# Patient Record
Sex: Female | Born: 1970 | Race: White | Hispanic: No | Marital: Married | Smoking: Former smoker
Health system: Southern US, Community
[De-identification: ages and names within clinical notes are randomized; demographics above are authoritative.]

## PROBLEM LIST (undated history)

## (undated) DIAGNOSIS — F329 Major depressive disorder, single episode, unspecified: Secondary | ICD-10-CM

## (undated) DIAGNOSIS — E669 Obesity, unspecified: Secondary | ICD-10-CM

## (undated) DIAGNOSIS — R87610 Atypical squamous cells of undetermined significance on cytologic smear of cervix (ASC-US): Secondary | ICD-10-CM

## (undated) DIAGNOSIS — J42 Unspecified chronic bronchitis: Secondary | ICD-10-CM

## (undated) DIAGNOSIS — N800 Endometriosis of uterus: Secondary | ICD-10-CM

## (undated) DIAGNOSIS — I10 Essential (primary) hypertension: Secondary | ICD-10-CM

## (undated) DIAGNOSIS — K219 Gastro-esophageal reflux disease without esophagitis: Secondary | ICD-10-CM

## (undated) DIAGNOSIS — J189 Pneumonia, unspecified organism: Secondary | ICD-10-CM

## (undated) DIAGNOSIS — F32A Depression, unspecified: Secondary | ICD-10-CM

## (undated) DIAGNOSIS — N8003 Adenomyosis of the uterus: Secondary | ICD-10-CM

## (undated) DIAGNOSIS — N920 Excessive and frequent menstruation with regular cycle: Secondary | ICD-10-CM

## (undated) DIAGNOSIS — N809 Endometriosis, unspecified: Secondary | ICD-10-CM

## (undated) HISTORY — DX: Adenomyosis of the uterus: N80.03

## (undated) HISTORY — DX: Pneumonia, unspecified organism: J18.9

## (undated) HISTORY — DX: Major depressive disorder, single episode, unspecified: F32.9

## (undated) HISTORY — DX: Excessive and frequent menstruation with regular cycle: N92.0

## (undated) HISTORY — DX: Atypical squamous cells of undetermined significance on cytologic smear of cervix (ASC-US): R87.610

## (undated) HISTORY — DX: Gastro-esophageal reflux disease without esophagitis: K21.9

## (undated) HISTORY — DX: Essential (primary) hypertension: I10

## (undated) HISTORY — DX: Endometriosis of uterus: N80.0

## (undated) HISTORY — DX: Unspecified chronic bronchitis: J42

## (undated) HISTORY — DX: Obesity, unspecified: E66.9

## (undated) HISTORY — DX: Depression, unspecified: F32.A

## (undated) HISTORY — DX: Endometriosis, unspecified: N80.9

---

## 1993-05-18 HISTORY — PX: TUBAL LIGATION: SHX77

## 2003-05-01 ENCOUNTER — Other Ambulatory Visit: Admission: RE | Admit: 2003-05-01 | Discharge: 2003-05-01 | Payer: Self-pay | Admitting: Family Medicine

## 2003-05-01 ENCOUNTER — Other Ambulatory Visit: Admission: RE | Admit: 2003-05-01 | Discharge: 2003-05-01 | Payer: Self-pay | Admitting: *Deleted

## 2003-05-08 ENCOUNTER — Encounter: Admission: RE | Admit: 2003-05-08 | Discharge: 2003-06-01 | Payer: Self-pay | Admitting: Family Medicine

## 2006-02-12 ENCOUNTER — Other Ambulatory Visit: Admission: RE | Admit: 2006-02-12 | Discharge: 2006-02-12 | Payer: Self-pay | Admitting: Family Medicine

## 2007-04-11 ENCOUNTER — Emergency Department (HOSPITAL_COMMUNITY): Admission: EM | Admit: 2007-04-11 | Discharge: 2007-04-11 | Payer: Self-pay | Admitting: Emergency Medicine

## 2009-12-12 ENCOUNTER — Encounter (INDEPENDENT_AMBULATORY_CARE_PROVIDER_SITE_OTHER): Payer: Self-pay | Admitting: *Deleted

## 2010-06-17 NOTE — Letter (Signed)
Summary: Unable to Reach, Consult Scheduled  [facility]  [address]   [place], [place] [address]   Phone: [phone]  Fax: [phone]    12/12/2009  Bethany Richardson [address] [place], [place]  [address] 08-29-1970   Dear Ms. Bethany Richardson,   We have been unable to reach you by phone.  Please contact our office with an updated phone number.  At the recommendation of DR Dimas Aguas  we have been asked to schedule you a consult with DR FIELDS for ABDOMINAL PAIN,RECTAL BLEEDING,DIARRHEA AND IBS SYMTOMS.   Please call our office at [phone].     Thank you,    Diana Eves  [facility] R. Roetta Sessions, M.D.    Jonette Eva, M.D. Lorenza Burton, FNP-BC    Tana Coast, PA-C Phone: [phone]    Fax: [phone]

## 2011-05-19 DIAGNOSIS — J189 Pneumonia, unspecified organism: Secondary | ICD-10-CM

## 2011-05-19 HISTORY — DX: Pneumonia, unspecified organism: J18.9

## 2012-08-25 DIAGNOSIS — R079 Chest pain, unspecified: Secondary | ICD-10-CM

## 2012-12-19 ENCOUNTER — Ambulatory Visit (INDEPENDENT_AMBULATORY_CARE_PROVIDER_SITE_OTHER): Payer: BC Managed Care – PPO

## 2012-12-19 ENCOUNTER — Other Ambulatory Visit: Payer: Self-pay | Admitting: Family Medicine

## 2012-12-19 ENCOUNTER — Telehealth: Payer: Self-pay | Admitting: Nurse Practitioner

## 2012-12-19 ENCOUNTER — Encounter: Payer: Self-pay | Admitting: Family Medicine

## 2012-12-19 ENCOUNTER — Ambulatory Visit (INDEPENDENT_AMBULATORY_CARE_PROVIDER_SITE_OTHER): Payer: BC Managed Care – PPO | Admitting: Family Medicine

## 2012-12-19 VITALS — BP 126/82 | HR 88 | Temp 97.7°F | Wt 234.6 lb

## 2012-12-19 DIAGNOSIS — R1032 Left lower quadrant pain: Secondary | ICD-10-CM

## 2012-12-19 LAB — POCT CBC
Granulocyte percent: 73.6 %G (ref 37–80)
HCT, POC: 40.1 % (ref 37.7–47.9)
Hemoglobin: 12.9 g/dL (ref 12.2–16.2)
Lymph, poc: 2.1 (ref 0.6–3.4)
MCH, POC: 22.1 pg — AB (ref 27–31.2)
MCHC: 32.1 g/dL (ref 31.8–35.4)
MCV: 69 fL — AB (ref 80–97)
MPV: 9.4 fL (ref 0–99.8)
POC Granulocyte: 6.9 (ref 2–6.9)
POC LYMPH PERCENT: 22.8 %L (ref 10–50)
Platelet Count, POC: 287 10*3/uL (ref 142–424)
RBC: 5.8 M/uL — AB (ref 4.04–5.48)
RDW, POC: 15.6 %
WBC: 9.4 10*3/uL (ref 4.6–10.2)

## 2012-12-19 LAB — POCT UA - MICROSCOPIC ONLY
Bacteria, U Microscopic: NEGATIVE
Casts, Ur, LPF, POC: NEGATIVE
Crystals, Ur, HPF, POC: NEGATIVE
RBC, urine, microscopic: NEGATIVE
Yeast, UA: NEGATIVE

## 2012-12-19 LAB — POCT URINALYSIS DIPSTICK
Bilirubin, UA: NEGATIVE
Blood, UA: NEGATIVE
Glucose, UA: NEGATIVE
Ketones, UA: NEGATIVE
Nitrite, UA: NEGATIVE
Spec Grav, UA: 1.02
Urobilinogen, UA: NEGATIVE
pH, UA: 6

## 2012-12-19 MED ORDER — NAPROXEN 500 MG PO TABS: 500.0000 mg | ORAL_TABLET | Freq: Two times a day (BID) | ORAL | Status: DC

## 2012-12-19 NOTE — Progress Notes (Signed)
Patient ID: Bethany Richardson, female   DOB: May 23, 1970, 42 y.o.   MRN: [id number] SUBJECTIVE: CC: Chief Complaint  Patient presents with  . Urinary Tract Infection    HPI: [facility] yeast infection and UTI. Treated  With diflucan and  Septra. This am going to work , pain in the left side and burns to urinate. Never had this before. No h/o STD, no vaginal d/c. No fever. Having cold chills, and hot flashes. Periods irregular last 3 months.  No past medical history on file. No past surgical history on file. History   Social History  . Marital Status: Married    Spouse Name: N/A    Number of Children: N/A  . Years of Education: N/A   Occupational History  . Not on file.   Social History Main Topics  . Smoking status: Not on file  . Smokeless tobacco: Not on file  . Alcohol Use: Not on file  . Drug Use: Not on file  . Sexually Active: Not on file   Other Topics Concern  . Not on file   Social History Narrative  . No narrative on file   No family history on file. No current outpatient prescriptions on file prior to visit.   No current facility-administered medications on file prior to visit.   No Known Allergies  There is no immunization history on file for this patient. Prior to Admission medications   Medication Sig Start Date End Date Taking? Authorizing Provider  hydrochlorothiazide (HYDRODIURIL) 25 MG tablet Take 25 mg by mouth daily.   Yes Historical Provider, MD  sulfamethoxazole-trimethoprim (BACTRIM DS,SEPTRA DS) 800-160 MG per tablet Take 1 tablet by mouth 2 (two) times daily.   Yes Historical Provider, MD     ROS: As above in the HPI. All other systems are stable or negative.  OBJECTIVE: APPEARANCE:  Patient in no acute distress.The patient appeared well nourished and normally developed. Acyanotic. Waist: VITAL SIGNS:BP 126/82  Pulse 88  Temp(Src) 97.7 F (36.5 C) (Oral)  Wt 234 lb 9.6 oz (106.414 kg) WF  SKIN:  warm and  Dry without overt rashes, tattoos and scars  HEAD and Neck: without JVD, Head and scalp: normal Eyes:No scleral icterus. Fundi normal, eye movements normal. Ears: Auricle normal, canal normal, Tympanic membranes normal, insufflation normal. Nose: normal Throat: normal Neck & thyroid: normal  CHEST & LUNGS: Chest wall: normal Lungs: Clear  CVS: Reveals the PMI to be normally located. Regular rhythm, First and Second Heart sounds are normal,  absence of murmurs, rubs or gallops. Peripheral vasculature: Radial pulses: normal Dorsal pedis pulses: normal Posterior pulses: normal  ABDOMEN:  Appearance: tender LLQ and left flank to palpation. Benign, no organomegaly, no masses, no Abdominal Aortic enlargement. No Guarding , no rebound. No Bruits. Bowel sounds: normal  RECTAL: N/A GU: N/A  EXTREMETIES: nonedematous. Both Femoral and Pedal pulses are normal.  MUSCULOSKELETAL:  Spine: normal Joints: intact  NEUROLOGIC: oriented to time,place and person; nonfocal. Strength is normal Sensory is normal Reflexes are normal Cranial Nerves are normal.  ASSESSMENT: Abdominal pain, left lower quadrant - Plan: POCT urinalysis dipstick, POCT UA - Microscopic Only, Urine culture, DG Abd 1 View, POCT CBC, CMP14+EGFR, Pregnancy, urine, US Pelvis Complete, CANCELED: POCT urine pregnancy  PLAN: Orders Placed This Encounter  Procedures  . Urine culture  . DG Abd 1 View    Standing Status: Future     Number of Occurrences: 1  Standing Expiration Date: 02/18/2014    Order Specific Question:  Reason for Exam (SYMPTOM  OR DIAGNOSIS REQUIRED)    Answer:  dysuria, left flank pain.    Order Specific Question:  Is the patient pregnant?    Answer:  No     Comments:  BTL    Order Specific Question:  Preferred imaging location?    Answer:  Internal  . US Pelvis Complete    Standing Status: Future     Number of Occurrences:      Standing Expiration Date: 02/18/2014     Scheduling Instructions:     Oligomenorrhea, pelvic pain and left flank pain, and dyspareunia.     S/P BTL     Patient is 41 and amenorrheic for 2 months.    Order Specific Question:  Reason for Exam (SYMPTOM  OR DIAGNOSIS REQUIRED)    Answer:  oligomenorrhea with left pelvic pain and dyspareunia.    Order Specific Question:  Preferred imaging location?    Answer:  [facility]  . CMP14+EGFR  . Pregnancy, urine  . POCT urinalysis dipstick  . POCT UA - Microscopic Only  . POCT CBC    [facility] reading (PRIMARY) by  Dr. Modesto Charon: no nephrolithiasis obvious nor cause for pain obvious.  Meds ordered this encounter  Medications  . sulfamethoxazole-trimethoprim (BACTRIM DS,SEPTRA DS) 800-160 MG per tablet    Sig: Take 1 tablet by mouth 2 (two) times daily.  . hydrochlorothiazide (HYDRODIURIL) 25 MG tablet    Sig: Take 25 mg by mouth daily.  . naproxen (NAPROSYN) 500 MG tablet    Sig: Take 1 tablet (500 mg total) by mouth 2 (two) times daily with a meal.    Dispense:  20 tablet    Refill:  0   Results for orders placed in visit on 12/19/12  POCT URINALYSIS DIPSTICK      Result Value Range   Color, UA yellow     Clarity, UA cloudy     Glucose, UA neg     Bilirubin, UA neg     Ketones, UA neg     Spec Grav, UA 1.020     Blood, UA neg     pH, UA 6.0     Protein, UA trace     Urobilinogen, UA negative     Nitrite, UA neg     Leukocytes, UA large (3+)    POCT CBC      Result Value Range   WBC 9.4  4.6 - 10.2 K/uL   Lymph, poc 2.1  0.6 - 3.4   POC LYMPH PERCENT 22.8  10 - 50 %L   POC Granulocyte 6.9  2 - 6.9   Granulocyte percent 73.6  37 - 80 %G   RBC 5.8 (*) 4.04 - 5.48 M/uL   Hemoglobin 12.9  12.2 - 16.2 g/dL   HCT, POC 16.1  09.6 - 47.9 %   MCV 69.0 (*) 80 - 97 fL   MCH, POC 22.1 (*) 27 - 31.2 pg   MCHC 32.1  31.8 - 35.4 g/dL   RDW, POC 04.5     Platelet Count, POC 287.0  142 - 424 K/uL   MPV 9.4  0 - 99.8 fL   Return in about 3 days (around 12/22/2012) for Recheck  medical problems.  Darrelle Wiberg P. Modesto Charon, M.D.

## 2012-12-19 NOTE — Telephone Encounter (Signed)
appt made

## 2012-12-20 ENCOUNTER — Ambulatory Visit (HOSPITAL_COMMUNITY)
Admission: RE | Admit: 2012-12-20 | Discharge: 2012-12-20 | Disposition: A | Discharge status: Home / Self Care | Payer: BC Managed Care – PPO | Source: Ambulatory Visit | Attending: Family Medicine | Admitting: Family Medicine

## 2012-12-20 ENCOUNTER — Telehealth: Payer: Self-pay | Admitting: Family Medicine

## 2012-12-20 ENCOUNTER — Ambulatory Visit (HOSPITAL_COMMUNITY): Payer: BC Managed Care – PPO

## 2012-12-20 DIAGNOSIS — N949 Unspecified condition associated with female genital organs and menstrual cycle: Secondary | ICD-10-CM | POA: Insufficient documentation

## 2012-12-20 DIAGNOSIS — R1032 Left lower quadrant pain: Secondary | ICD-10-CM

## 2012-12-20 DIAGNOSIS — IMO0002 Reserved for concepts with insufficient information to code with codable children: Secondary | ICD-10-CM | POA: Insufficient documentation

## 2012-12-20 DIAGNOSIS — N915 Oligomenorrhea, unspecified: Secondary | ICD-10-CM | POA: Insufficient documentation

## 2012-12-20 LAB — CMP14+EGFR
ALT: 20 IU/L (ref 0–32)
AST: 17 IU/L (ref 0–40)
Albumin/Globulin Ratio: 1.8 (ref 1.1–2.5)
Albumin: 4.5 g/dL (ref 3.5–5.5)
Alkaline Phosphatase: 91 IU/L (ref 39–117)
BUN/Creatinine Ratio: 11 (ref 9–23)
BUN: 12 mg/dL (ref 6–24)
CO2: 26 mmol/L (ref 18–29)
Calcium: 9.4 mg/dL (ref 8.7–10.2)
Chloride: 97 mmol/L (ref 97–108)
Creatinine, Ser: 1.13 mg/dL — ABNORMAL HIGH (ref 0.57–1.00)
GFR calc Af Amer: 70 mL/min/{1.73_m2} (ref >59)
GFR calc non Af Amer: 61 mL/min/{1.73_m2} (ref >59)
Globulin, Total: 2.5 g/dL (ref 1.5–4.5)
Glucose: 93 mg/dL (ref 65–99)
Potassium: 4.4 mmol/L (ref 3.5–5.2)
Sodium: 137 mmol/L (ref 134–144)
Total Bilirubin: 0.3 mg/dL (ref 0.0–1.2)
Total Protein: 7 g/dL (ref 6.0–8.5)

## 2012-12-20 LAB — URINE CULTURE: Organism ID, Bacteria: NO GROWTH

## 2012-12-20 LAB — PREGNANCY, URINE: Preg Test, Ur: NEGATIVE

## 2012-12-20 NOTE — Telephone Encounter (Signed)
Spoke with pt and she rates pain 6 and per dr Modesto Charon if pain increases tonite she can go to ER or can call in am and speak with traige nurse for appt

## 2012-12-20 NOTE — Telephone Encounter (Signed)
U/S was negative. If she is in pain needs to see someone for  [facility] tomorrow.

## 2012-12-21 NOTE — Progress Notes (Signed)
Quick Note:  Call patient. Labs normal. No change in plan. ______ 

## 2012-12-22 ENCOUNTER — Encounter: Payer: Self-pay | Admitting: Family Medicine

## 2012-12-22 ENCOUNTER — Ambulatory Visit (INDEPENDENT_AMBULATORY_CARE_PROVIDER_SITE_OTHER): Payer: BC Managed Care – PPO | Admitting: Family Medicine

## 2012-12-22 VITALS — BP 128/76 | HR 65 | Temp 97.3°F | Ht 67.0 in | Wt 239.0 lb

## 2012-12-22 DIAGNOSIS — R1032 Left lower quadrant pain: Secondary | ICD-10-CM

## 2012-12-22 DIAGNOSIS — N915 Oligomenorrhea, unspecified: Secondary | ICD-10-CM

## 2012-12-22 DIAGNOSIS — M940 Chondrocostal junction syndrome [Tietze]: Secondary | ICD-10-CM | POA: Insufficient documentation

## 2012-12-22 DIAGNOSIS — D219 Benign neoplasm of connective and other soft tissue, unspecified: Secondary | ICD-10-CM

## 2012-12-22 DIAGNOSIS — IMO0002 Reserved for concepts with insufficient information to code with codable children: Secondary | ICD-10-CM

## 2012-12-22 DIAGNOSIS — D259 Leiomyoma of uterus, unspecified: Secondary | ICD-10-CM

## 2012-12-22 DIAGNOSIS — K219 Gastro-esophageal reflux disease without esophagitis: Secondary | ICD-10-CM | POA: Insufficient documentation

## 2012-12-22 MED ORDER — OMEPRAZOLE 40 MG PO CPDR: 40.0000 mg | DELAYED_RELEASE_CAPSULE | Freq: Every day | ORAL | Status: DC

## 2012-12-22 MED ORDER — NAPROXEN 500 MG PO TABS: 500.0000 mg | ORAL_TABLET | Freq: Two times a day (BID) | ORAL | Status: DC

## 2012-12-22 NOTE — Progress Notes (Signed)
Patient ID: Bethany Richardson, female   DOB: November 23, 1970, 42 y.o.   MRN: [id number] SUBJECTIVE: CC: Chief Complaint  Patient presents with  . Follow-up    went to [facility] had [facility],etc was told may need referral to GI     HPI: Here for  Recheck. Had to go to the ED at [facility]. See extensive workup records to be scanned into record. Pain left lower ribs. Husband now recalls that she tends to get reflux and that she got up because of the reflux and was coughing and then the pain started.  Dyspareunia was a different issue.  No past medical history on file. No past surgical history on file. History   Social History  . Marital Status: Married    Spouse Name: N/A    Number of Children: N/A  . Years of Education: N/A   Occupational History  . Not on file.   Social History Main Topics  . Smoking status: Never Smoker   . Smokeless tobacco: Not on file  . Alcohol Use: Not on file  . Drug Use: Not on file  . Sexually Active: Not on file   Other Topics Concern  . Not on file   Social History Narrative  . No narrative on file   No family history on file. Current Outpatient Prescriptions on File Prior to Visit  Medication Sig Dispense Refill  . hydrochlorothiazide (HYDRODIURIL) 25 MG tablet Take 25 mg by mouth daily.       No current facility-administered medications on file prior to visit.   No Known Allergies  There is no immunization history on file for this patient. Prior to Admission medications   Medication Sig Start Date End Date Taking? Authorizing Provider  hydrochlorothiazide (HYDRODIURIL) 25 MG tablet Take 25 mg by mouth daily.   Yes Historical Provider, MD  naproxen (NAPROSYN) 500 MG tablet Take 1 tablet (500 mg total) by mouth 2 (two) times daily with a meal. 12/22/12  Yes Ileana Ladd, MD  omeprazole (PRILOSEC) 40 MG capsule Take 1 capsule (40 mg total) by mouth daily. 12/22/12   Ileana Ladd, MD     ROS: As above in the HPI. All other systems are stable  or negative.  OBJECTIVE: APPEARANCE:  Patient in no acute distress.The patient appeared well nourished and normally developed. Acyanotic. Waist: VITAL SIGNS:BP 128/76  Pulse 65  Temp(Src) 97.3 F (36.3 C) (Oral)  Ht 5\' 7"  (1.702 m)  Wt 239 lb (108.41 kg)  BMI 37.42 kg/m2  LMP 10/22/2012 WF obese  SKIN: warm and  Dry without overt rashes, tattoos and scars  HEAD and Neck: without JVD, Head and scalp: normal Eyes:No scleral icterus. Fundi normal, eye movements normal. Ears: Auricle normal, canal normal, Tympanic membranes normal, insufflation normal. Nose: normal Throat: normal Neck & thyroid: normal  CHEST & LUNGS: Chest wall: tender left lower chest wall. At the costal margins.Lungs: Clear  CVS: Reveals the PMI to be normally located. Regular rhythm, First and Second Heart sounds are normal,  absence of murmurs, rubs or gallops. Peripheral vasculature: Radial pulses: normal Dorsal pedis pulses: normal Posterior pulses: normal  ABDOMEN:  Appearance: Obese Benign, no organomegaly, no masses, no Abdominal Aortic enlargement. No Guarding , no rebound. No Bruits. Bowel sounds: normal  RECTAL: N/A GU: N/A  EXTREMETIES: nonedematous. Both Femoral and Pedal pulses are normal.  MUSCULOSKELETAL:  Spine: normal Joints: intact Left lower chst wall along the costal margin and the cartilage area is very tender.  NEUROLOGIC: oriented  to time,place and person; nonfocal. Strength is normal Sensory is normal Reflexes are normal Cranial Nerves are normal.  Results for orders placed in visit on 12/19/12  URINE CULTURE      Result Value Range   Urine Culture, Routine Final report     Result 1 No growth    CMP14+EGFR      Result Value Range   Glucose 93  65 - 99 mg/dL   BUN 12  6 - 24 mg/dL   Creatinine, Ser 9.60 (*) 0.57 - 1.00 mg/dL   GFR calc non Af Amer 61  >59 mL/min/1.73   GFR calc Af Amer 70  >59 mL/min/1.73   BUN/Creatinine Ratio 11  9 - 23   Sodium 137   134 - 144 mmol/L   Potassium 4.4  3.5 - 5.2 mmol/L   Chloride 97  97 - 108 mmol/L   CO2 26  18 - 29 mmol/L   Calcium 9.4  8.7 - 10.2 mg/dL   Total Protein 7.0  6.0 - 8.5 g/dL   Albumin 4.5  3.5 - 5.5 g/dL   Globulin, Total 2.5  1.5 - 4.5 g/dL   Albumin/Globulin Ratio 1.8  1.1 - 2.5   Total Bilirubin 0.3  0.0 - 1.2 mg/dL   Alkaline Phosphatase 91  39 - 117 IU/L   AST 17  0 - 40 IU/L   ALT 20  0 - 32 IU/L  PREGNANCY, URINE      Result Value Range   Preg Test, Ur Negative  Negative  POCT URINALYSIS DIPSTICK      Result Value Range   Color, UA yellow     Clarity, UA cloudy     Glucose, UA neg     Bilirubin, UA neg     Ketones, UA neg     Spec Grav, UA 1.020     Blood, UA neg     pH, UA 6.0     Protein, UA trace     Urobilinogen, UA negative     Nitrite, UA neg     Leukocytes, UA large (3+)    POCT UA - MICROSCOPIC ONLY      Result Value Range   WBC, Ur, HPF, POC 20-30     RBC, urine, microscopic neg     Bacteria, U Microscopic neg     Mucus, UA mod     Epithelial cells, urine per micros occ     Crystals, Ur, HPF, POC neg     Casts, Ur, LPF, POC neg     Yeast, UA neg    POCT CBC      Result Value Range   WBC 9.4  4.6 - 10.2 K/uL   Lymph, poc 2.1  0.6 - 3.4   POC LYMPH PERCENT 22.8  10 - 50 %L   POC Granulocyte 6.9  2 - 6.9   Granulocyte percent 73.6  37 - 80 %G   RBC 5.8 (*) 4.04 - 5.48 M/uL   Hemoglobin 12.9  12.2 - 16.2 g/dL   HCT, POC 45.4  09.8 - 47.9 %   MCV 69.0 (*) 80 - 97 fL   MCH, POC 22.1 (*) 27 - 31.2 pg   MCHC 32.1  31.8 - 35.4 g/dL   RDW, POC 11.9     Platelet Count, POC 287.0  142 - 424 K/uL   MPV 9.4  0 - 99.8 fL    ASSESSMENT: Abdominal pain, left lower quadrant - Plan: naproxen (NAPROSYN) 500 MG  tablet  GERD (gastroesophageal reflux disease) - Plan: omeprazole (PRILOSEC) 40 MG capsule  Costochondritis - Plan: naproxen (NAPROSYN) 500 MG tablet  Dyspareunia  Oligomenorrhea - Plan: Ambulatory referral to Gynecology  Fibroids I suspect  that she has 2 problems: Fibroids and left lower costochondritis.  PLAN: Orders Placed This Encounter  Procedures  . Ambulatory referral to Gynecology    Referral Priority:  Routine    Referral Type:  Consultation    Referral Reason:  Specialty Services Required    Requested Specialty:  Gynecology    Number of Visits Requested:  1    Meds ordered this encounter  Medications  . naproxen (NAPROSYN) 500 MG tablet    Sig: Take 1 tablet (500 mg total) by mouth 2 (two) times daily with a meal.    Dispense:  60 tablet    Refill:  0  . omeprazole (PRILOSEC) 40 MG capsule    Sig: Take 1 capsule (40 mg total) by mouth daily.    Dispense:  30 capsule    Refill:  1        Dr Woodroe Mode Recommendations  Diet and Exercise discussed with patient.  For nutrition information, I recommend books:  1).Eat to Live by Dr Monico Hoar. 2).Prevent and Reverse Heart Disease by Dr Suzzette Righter. 3) Dr Katherina Right Book:  Program to Reverse Diabetes  Exercise recommendations are:  If unable to walk, then the patient can exercise in a chair 3 times a day. By flapping arms like a bird gently and raising legs outwards to the front.  If ambulatory, the patient can go for walks for 30 minutes 3 times a week. Then increase the intensity and duration as tolerated.  Goal is to try to attain exercise frequency to 5 times a week.  If applicable: Best to perform resistance exercises (machines or weights) 2 days a week and cardio type exercises 3 days per week.  GERD information in AVS Return in about 6 weeks (around 02/02/2013) for Recheck medical problems.  Bethany Richardson P. Modesto Charon, M.D.

## 2012-12-22 NOTE — Patient Instructions (Addendum)
Gastroesophageal Reflux Disease, Adult Gastroesophageal reflux disease (GERD) happens when acid from your stomach flows up into the esophagus. When acid comes in contact with the esophagus, the acid causes soreness (inflammation) in the esophagus. Over time, GERD may create small holes (ulcers) in the lining of the esophagus. CAUSES   Increased body weight. This puts pressure on the stomach, making acid rise from the stomach into the esophagus.  Smoking. This increases acid production in the stomach.  Drinking alcohol. This causes decreased pressure in the lower esophageal sphincter (valve or ring of muscle between the esophagus and stomach), allowing acid from the stomach into the esophagus.  Late evening meals and a full stomach. This increases pressure and acid production in the stomach.  A malformed lower esophageal sphincter. Sometimes, no cause is found. SYMPTOMS   Burning pain in the lower part of the mid-chest behind the breastbone and in the mid-stomach area. This may occur twice a week or more often.  Trouble swallowing.  Sore throat.  Dry cough.  Asthma-like symptoms including chest tightness, shortness of breath, or wheezing. DIAGNOSIS  Your caregiver may be able to diagnose GERD based on your symptoms. In some cases, X-rays and other tests may be done to check for complications or to check the condition of your stomach and esophagus. TREATMENT  Your caregiver may recommend over-the-counter or prescription medicines to help decrease acid production. Ask your caregiver before starting or adding any new medicines.  HOME CARE INSTRUCTIONS   Change the factors that you can control. Ask your caregiver for guidance concerning weight loss, quitting smoking, and alcohol consumption.  Avoid foods and drinks that make your symptoms worse, such as:  Caffeine or alcoholic drinks.  Chocolate.  Peppermint or mint flavorings.  Garlic and onions.  Spicy foods.  Citrus fruits,  such as oranges, lemons, or limes.  Tomato-based foods such as sauce, chili, salsa, and pizza.  Fried and fatty foods.  Avoid lying down for the 3 hours prior to your bedtime or prior to taking a nap.  Eat small, frequent meals instead of large meals.  Wear loose-fitting clothing. Do not wear anything tight around your waist that causes pressure on your stomach.  Raise the head of your bed 6 to 8 inches with wood blocks to help you sleep. Extra pillows will not help.  Only take over-the-counter or prescription medicines for pain, discomfort, or fever as directed by your caregiver.  Do not take aspirin, ibuprofen, or other nonsteroidal anti-inflammatory drugs (NSAIDs). SEEK IMMEDIATE MEDICAL CARE IF:   You have pain in your arms, neck, jaw, teeth, or back.  Your pain increases or changes in intensity or duration.  You develop nausea, vomiting, or sweating (diaphoresis).  You develop shortness of breath, or you faint.  Your vomit is green, yellow, black, or looks like coffee grounds or blood.  Your stool is red, bloody, or black. These symptoms could be signs of other problems, such as heart disease, gastric bleeding, or esophageal bleeding. MAKE SURE YOU:   Understand these instructions.  Will watch your condition.  Will get help right away if you are not doing well or get worse. Document Released: 02/11/2005 Document Revised: 07/27/2011 Document Reviewed: 11/21/2010 [facility] Patient Information 2014 [place], [place].   Costochondritis Costochondritis (Tietze syndrome), or costochondral separation, is a swelling and irritation (inflammation) of the tissue (cartilage) that connects your ribs with your breastbone (sternum). It may occur on its own (spontaneously), through damage caused by an accident (trauma), or simply from coughing  or minor exercise. It may take up to 6 weeks to get better and longer if you are unable to be conservative in your activities. HOME CARE  INSTRUCTIONS   Avoid exhausting physical activity. Try not to strain your ribs during normal activity. This would include any activities using chest, belly (abdominal), and side muscles, especially if heavy weights are used.  Use ice for 15-20 minutes per hour while awake for the first 2 days. Place the ice in a plastic bag, and place a towel between the bag of ice and your skin.  Only take over-the-counter or prescription medicines for pain, discomfort, or fever as directed by your caregiver. SEEK IMMEDIATE MEDICAL CARE IF:   Your pain increases or you are very uncomfortable.  You have a fever.  You develop difficulty with your breathing.  You cough up blood.  You develop worse chest pains, shortness of breath, sweating, or vomiting.  You develop new, unexplained problems (symptoms). MAKE SURE YOU:   Understand these instructions.  Will watch your condition.  Will get help right away if you are not doing well or get worse. Document Released: 02/11/2005 Document Revised: 07/27/2011 Document Reviewed: 12/21/2007 [facility] Patient Information 2014 [place], [place].        Dr Woodroe Mode Recommendations  Diet and Exercise discussed with patient.  For nutrition information, I recommend books:  1).Eat to Live by Dr Monico Hoar. 2).Prevent and Reverse Heart Disease by Dr Suzzette Righter. 3) Dr Katherina Right Book:  Program to Reverse Diabetes  Exercise recommendations are:  If unable to walk, then the patient can exercise in a chair 3 times a day. By flapping arms like a bird gently and raising legs outwards to the front.  If ambulatory, the patient can go for walks for 30 minutes 3 times a week. Then increase the intensity and duration as tolerated.  Goal is to try to attain exercise frequency to 5 times a week.  If applicable: Best to perform resistance exercises (machines or weights) 2 days a week and cardio type exercises 3 days per week.

## 2013-01-02 ENCOUNTER — Encounter: Payer: Self-pay | Admitting: *Deleted

## 2013-01-12 ENCOUNTER — Telehealth: Payer: Self-pay | Admitting: Family Medicine

## 2013-01-12 ENCOUNTER — Other Ambulatory Visit: Payer: Self-pay | Admitting: Family Medicine

## 2013-01-12 DIAGNOSIS — K219 Gastro-esophageal reflux disease without esophagitis: Secondary | ICD-10-CM

## 2013-01-12 MED ORDER — PANTOPRAZOLE SODIUM 40 MG PO TBEC: 40.0000 mg | DELAYED_RELEASE_TABLET | Freq: Every day | ORAL | Status: DC

## 2013-01-12 NOTE — Telephone Encounter (Signed)
Pt aware of rx and does have appt for 01-27-13

## 2013-01-12 NOTE — Telephone Encounter (Signed)
Prescription changed to a different one in EPIC. Needs office visit if not resolved.

## 2013-01-31 ENCOUNTER — Encounter: Payer: Self-pay | Admitting: Obstetrics & Gynecology

## 2013-01-31 ENCOUNTER — Encounter: Payer: Self-pay | Admitting: *Deleted

## 2013-01-31 ENCOUNTER — Ambulatory Visit (INDEPENDENT_AMBULATORY_CARE_PROVIDER_SITE_OTHER): Payer: BC Managed Care – PPO | Admitting: Obstetrics & Gynecology

## 2013-01-31 VITALS — BP 144/87 | HR 88 | Resp 16 | Ht 67.0 in | Wt 244.0 lb

## 2013-01-31 DIAGNOSIS — Z113 Encounter for screening for infections with a predominantly sexual mode of transmission: Secondary | ICD-10-CM

## 2013-01-31 DIAGNOSIS — Z01419 Encounter for gynecological examination (general) (routine) without abnormal findings: Secondary | ICD-10-CM

## 2013-01-31 DIAGNOSIS — Z1151 Encounter for screening for human papillomavirus (HPV): Secondary | ICD-10-CM

## 2013-01-31 DIAGNOSIS — N946 Dysmenorrhea, unspecified: Secondary | ICD-10-CM | POA: Insufficient documentation

## 2013-01-31 DIAGNOSIS — N921 Excessive and frequent menstruation with irregular cycle: Secondary | ICD-10-CM | POA: Insufficient documentation

## 2013-01-31 DIAGNOSIS — N920 Excessive and frequent menstruation with regular cycle: Secondary | ICD-10-CM

## 2013-01-31 DIAGNOSIS — Z124 Encounter for screening for malignant neoplasm of cervix: Secondary | ICD-10-CM

## 2013-01-31 MED ORDER — NORETHIN ACE-ETH ESTRAD-FE 1-20 MG-MCG(24) PO TABS: 1.0000 | ORAL_TABLET | Freq: Every day | ORAL | Status: DC

## 2013-01-31 MED ORDER — NORGESTIM-ETH ESTRAD TRIPHASIC 0.18/0.215/0.25 MG-25 MCG PO TABS: 1.0000 | ORAL_TABLET | Freq: Every day | ORAL | Status: DC

## 2013-01-31 NOTE — Patient Instructions (Signed)
Perimenopause Perimenopause is the time when your body begins to move into the menopause (no menstrual period for 12 straight months). It is a natural process. Perimenopause can begin 2 to 8 years before the menopause and usually lasts for one year after the menopause. During this time, your ovaries may or may not produce an egg. The ovaries vary in their production of estrogen and progesterone hormones each month. This can cause irregular menstrual periods, difficulty in getting pregnant, vaginal bleeding between periods and uncomfortable symptoms. CAUSES  Irregular production of the ovarian hormones, estrogen and progesterone, and not ovulating every month.  Other causes include:  Tumor of the pituitary gland in the brain.  Medical disease that affects the ovaries.  Radiation treatment.  Chemotherapy.  Unknown causes.  Heavy smoking and excessive alcohol intake can bring on perimenopause sooner. SYMPTOMS   Hot flashes.  Night sweats.  Irregular menstrual periods.  Decrease sex drive.  Vaginal dryness.  Headaches.  Mood swings.  Depression.  Memory problems.  Irritability.  Tiredness.  Weight gain.  Trouble getting pregnant.  The beginning of losing bone cells (osteoporosis).  The beginning of hardening of the arteries (atherosclerosis). DIAGNOSIS  Your caregiver will make a diagnosis by analyzing your age, menstrual history and your symptoms. They will do a physical exam noting any changes in your body, especially your female organs. Female hormone tests may or may not be helpful depending on the amount and when you produce the female hormones. However, other hormone tests may be helpful (ex. thyroid hormone) to rule out other problems. TREATMENT  The decision to treat during the perimenopause should be made by you and your caregiver depending on how the symptoms are affecting you and your life style. There are various treatments available such as:  Treating  individual symptoms with a specific medication for that symptom (ex. tranquilizer for depression).  Herbal medications that can help specific symptoms.  Counseling.  Group therapy.  No treatment. HOME CARE INSTRUCTIONS   Before seeing your caregiver, make a list of your menstrual periods (when the occur, how heavy they are, how long between periods and how long they last), your symptoms and when they started.  Take the medication as recommended by your caregiver.  Sleep and rest.  Exercise.  Eat a diet that contains calcium (good for your bones) and soy (acts like estrogen hormone).  Do not smoke.  Avoid alcoholic beverages.  Taking vitamin E may help in certain cases.  Take calcium and vitamin D supplements to help prevent bone loss.  Group therapy is sometimes helpful.  Acupuncture may help in some cases. SEEK MEDICAL CARE IF:   You have any of the above and want to know if it is perimenopause.  You want advice and treatment for any of your symptoms mentioned above.  You need a referral to a specialist (gynecologist, psychiatrist or psychologist). SEEK IMMEDIATE MEDICAL CARE IF:   You have vaginal bleeding.  Your period lasts longer than 8 days.  You periods are recurring sooner than 21 days.  You have bleeding after intercourse.  You have severe depression.  You have pain when you urinate.  You have severe headaches.  You develop vision problems. Document Released: 06/11/2004 Document Revised: 07/27/2011 Document Reviewed: 03/01/2008 [employer] Patient Information 2014 [employer].  

## 2013-01-31 NOTE — Progress Notes (Signed)
Patient ID: Bethany Richardson, female   DOB: Oct 29, 1970, 42 y.o.   MRN: [id number]  Chief Complaint  Patient presents with  . Gynecologic Exam    Heavy menstral cycles that are irregular - Pt is very emotional for no reason - Pt has RLQ pain thinking its coming from right ovary    HPI Bethany Richardson is a 42 y.o. female.  LLQ, low back, left leg pain. H/O bulging disc. Patient's last menstrual period was 01/17/2013. Menses last 1 week and are heavy. S/P BTL  HPI  Past Medical History  Diagnosis Date  . GERD (gastroesophageal reflux disease)   . Adenomyosis   . Obesity   . Depression   . Pneumonia 2013  . Hypertension   . Chronic bronchitis     Past Surgical History  Procedure Laterality Date  . Tubal ligation Bilateral 1995    History reviewed. No pertinent family history.  Social History History  Substance Use Topics  . Smoking status: Former Smoker    Quit date: 01/31/2009  . Smokeless tobacco: Never Used  . Alcohol Use: No    No Known Allergies  Current Outpatient Prescriptions  Medication Sig Dispense Refill  . hydrochlorothiazide (HYDRODIURIL) 25 MG tablet Take 25 mg by mouth daily.      . naproxen (NAPROSYN) 500 MG tablet Take 1 tablet (500 mg total) by mouth 2 (two) times daily with a meal.  60 tablet  0  . pantoprazole (PROTONIX) 40 MG tablet Take 1 tablet (40 mg total) by mouth daily.  30 tablet  3  . Norethindrone Acetate-Ethinyl Estrad-FE (LOESTRIN 24 FE) 1-20 MG-MCG(24) tablet Take 1 tablet by mouth daily.  1 Package  11   No current facility-administered medications for this visit.    Review of Systems Review of Systems  Gastrointestinal: Negative for constipation and abdominal distention.  Genitourinary: Positive for menstrual problem. Negative for flank pain, vaginal bleeding, vaginal discharge and pelvic pain.  Musculoskeletal: Positive for back pain.       Pain down left leg    Blood pressure 144/87, pulse 88, resp. rate 16, height 5\' 7"  (1.702  m), weight 244 lb (110.678 kg), last menstrual period 01/17/2013.  Physical Exam Physical Exam  Nursing note and vitals reviewed. Constitutional: She is oriented to person, place, and time. She appears well-developed. No distress.  Abdominal: Soft. She exhibits no distension and no mass. There is no tenderness.  Genitourinary: Vagina normal and uterus normal. No vaginal discharge found.  No masses, pap done  Neurological: She is alert and oriented to person, place, and time.  Skin: Skin is warm and dry.  Psychiatric: She has a normal mood and affect. Her behavior is normal.    Data Reviewed  *RADIOLOGY REPORT*  Clinical Data: Oligomenorrhea, left-sided pelvic pain.  Dyspareunia.  TRANSABDOMINAL AND TRANSVAGINAL ULTRASOUND OF PELVIS  Technique: Both transabdominal and transvaginal ultrasound  examinations of the pelvis were performed. Transabdominal  technique was performed for global imaging of the pelvis including  uterus, ovaries, adnexal regions, and pelvic cul-de-sac.  It was necessary to proceed with endovaginal exam following the  transabdominal exam to visualize the endometrium and ovaries.  Comparison: CT 01/03/2012, ultrasound 12/12/2004  Findings:  Uterus: 8.9 x 6.8 x 5.0 cm. Inhomogeneity of the myometrium may  suggest adenomyosis.  Endometrium: 1.7 cm, borderline trilaminar in appearance and mildly  inhomogeneous but no focal measurable abnormality.  Right ovary: Seen transabdominally only, 5.1 x 3.2 x 2.7 cm.  Hypoechoic cyst, likely physiologic, measures  3.5 x 2.4 x 1.9 cm.  Left ovary: 3.4 x 2.4 x 2.1 cm. Normal.  Other Findings: No free fluid  IMPRESSION:  No abnormality to explain the history of left lower quadrant pelvic  pain. Probable physiologic right ovarian cyst.  Inhomogeneous myometrium may suggest adenomyosis, which could  account for the history of pelvic pain and dyspareunia  specifically.  Original Report Authenticated By: Christiana Pellant, M.D.    Assessment    Menorrhagia,adenomyosis Perimenopause. Left side pain may be related to back problems.     Plan    OCP TSH, FSH RTC check progress, BP       Sarika Baldini 01/31/2013, 10:25 AM

## 2013-02-01 LAB — TESTOSTERONE, FREE, TOTAL, SHBG
Testosterone, Free: 13.9 pg/mL — ABNORMAL HIGH (ref 0.6–6.8)
Testosterone: 63 ng/dL (ref 10–70)

## 2013-02-01 LAB — TSH: TSH: 3.658 u[IU]/mL (ref 0.350–4.500)

## 2013-02-02 ENCOUNTER — Encounter: Payer: Self-pay | Admitting: Family Medicine

## 2013-02-02 ENCOUNTER — Ambulatory Visit (INDEPENDENT_AMBULATORY_CARE_PROVIDER_SITE_OTHER): Payer: BC Managed Care – PPO | Admitting: Family Medicine

## 2013-02-02 VITALS — BP 118/71 | HR 75 | Temp 97.2°F | Ht 67.0 in | Wt 241.0 lb

## 2013-02-02 DIAGNOSIS — K219 Gastro-esophageal reflux disease without esophagitis: Secondary | ICD-10-CM

## 2013-02-02 DIAGNOSIS — N946 Dysmenorrhea, unspecified: Secondary | ICD-10-CM

## 2013-02-02 DIAGNOSIS — IMO0002 Reserved for concepts with insufficient information to code with codable children: Secondary | ICD-10-CM

## 2013-02-02 DIAGNOSIS — J029 Acute pharyngitis, unspecified: Secondary | ICD-10-CM | POA: Insufficient documentation

## 2013-02-02 LAB — POCT RAPID STREP A (OFFICE): Rapid Strep A Screen: NEGATIVE

## 2013-02-02 NOTE — Progress Notes (Signed)
Patient ID: Bethany Richardson, female   DOB: November 25, 1970, 42 y.o.   MRN: [id number] SUBJECTIVE: CC: Chief Complaint  Patient presents with  . Follow-up    6week follow up   c/o sore throat .saw Dr Debroah Loop thinks estrogen to high now on irth control pill    HPI: GERD doing well on the present medication. Asymptomatic at present.  Having a sorethroat for 2 days. Runny nose and sneezing.needs this checked. No cough, no fever.  Saw the gynecologist and placed on hormones . Will see how she does with it and the dysmenorrhea.   Past Medical History  Diagnosis Date  . GERD (gastroesophageal reflux disease)   . Adenomyosis   . Obesity   . Depression   . Pneumonia 2013  . Hypertension   . Chronic bronchitis    Past Surgical History  Procedure Laterality Date  . Tubal ligation Bilateral 1995   History   Social History  . Marital Status: Married    Spouse Name: N/A    Number of Children: N/A  . Years of Education: N/A   Occupational History  . Not on file.   Social History Main Topics  . Smoking status: Former Smoker    Quit date: 01/31/2009  . Smokeless tobacco: Never Used  . Alcohol Use: No  . Drug Use: No  . Sexual Activity: Yes    Partners: Male   Other Topics Concern  . Not on file   Social History Narrative  . No narrative on file   No family history on file. Current Outpatient Prescriptions on File Prior to Visit  Medication Sig Dispense Refill  . hydrochlorothiazide (HYDRODIURIL) 25 MG tablet Take 25 mg by mouth daily.      . naproxen (NAPROSYN) 500 MG tablet Take 1 tablet (500 mg total) by mouth 2 (two) times daily with a meal.  60 tablet  0  . pantoprazole (PROTONIX) 40 MG tablet Take 1 tablet (40 mg total) by mouth daily.  30 tablet  3   No current facility-administered medications on file prior to visit.   No Known Allergies  There is no immunization history on file for this patient. Prior to Admission medications   Medication Sig Start Date End Date  Taking? Authorizing Provider  Norgestimate-Ethinyl Estradiol Triphasic (TRI-SPRINTEC) 0.18/0.215/0.25 MG-35 MCG tablet Take 1 tablet by mouth daily.   Yes Historical Provider, MD  hydrochlorothiazide (HYDRODIURIL) 25 MG tablet Take 25 mg by mouth daily.    Historical Provider, MD  naproxen (NAPROSYN) 500 MG tablet Take 1 tablet (500 mg total) by mouth 2 (two) times daily with a meal. 12/22/12   Ileana Ladd, MD  pantoprazole (PROTONIX) 40 MG tablet Take 1 tablet (40 mg total) by mouth daily. 01/12/13   Ileana Ladd, MD     ROS: As above in the HPI. All other systems are stable or negative.  OBJECTIVE: APPEARANCE:  Patient in no acute distress.The patient appeared well nourished and normally developed. Acyanotic. Waist: VITAL SIGNS:BP 118/71  Pulse 75  Temp(Src) 97.2 F (36.2 C) (Oral)  Ht 5\' 7"  (1.702 m)  Wt 241 lb (109.317 kg)  BMI 37.74 kg/m2  LMP 01/17/2013 WF Obese  SKIN: warm and  Dry without overt rashes, tattoos and scars  HEAD and Neck: without JVD, Head and scalp: normal Eyes:No scleral icterus. Fundi normal, eye movements normal. Ears: Auricle normal, canal normal, Tympanic membranes normal, insufflation normal. Nose: normal Throat: normal Neck & thyroid: normal  CHEST & LUNGS: Chest wall:  normal Lungs: Clear  CVS: Reveals the PMI to be normally located. Regular rhythm, First and Second Heart sounds are normal,  absence of murmurs, rubs or gallops. Peripheral vasculature: Radial pulses: normal Dorsal pedis pulses: normal Posterior pulses: normal  ABDOMEN:  Appearance: obese Benign, no organomegaly, no masses, no Abdominal Aortic enlargement. No Guarding , no rebound. No Bruits. Bowel sounds: normal  RECTAL: N/A GU: N/A  EXTREMETIES: nonedematous.  MUSCULOSKELETAL:  Spine: normal Joints: intact  NEUROLOGIC: oriented to time,place and person; nonfocal. Strength is normal Sensory is normal Reflexes are normal Cranial Nerves are  normal.  ASSESSMENT: GERD (gastroesophageal reflux disease)  Sore throat - Plan: POCT rapid strep A  Dyspareunia  Dysmenorrhea   PLAN: Orders Placed This Encounter  Procedures  . POCT rapid strep A    Results for orders placed in visit on 02/02/13  POCT RAPID STREP A (OFFICE)      Result Value Range   Rapid Strep A Screen Negative  Negative    Samples of cepacol given. Suspect it is the prevalent enterovirus and that hand washing hygiene and conservative measures is all that is needed.  Updated medications.  Continue present medication regimen.  Healthy lifestyle.  Return if symptoms worsen or fail to improve.  Tristen Pennino P. Modesto Charon, M.D.

## 2013-02-06 ENCOUNTER — Telehealth: Payer: Self-pay | Admitting: *Deleted

## 2013-02-06 DIAGNOSIS — B9689 Other specified bacterial agents as the cause of diseases classified elsewhere: Secondary | ICD-10-CM

## 2013-02-06 MED ORDER — METRONIDAZOLE 500 MG PO TABS: 500.0000 mg | ORAL_TABLET | Freq: Two times a day (BID) | ORAL | Status: DC

## 2013-02-06 NOTE — Telephone Encounter (Signed)
Called pt to adv meds at pharm to treat BV

## 2013-02-07 ENCOUNTER — Encounter: Payer: Self-pay | Admitting: *Deleted

## 2013-02-28 ENCOUNTER — Encounter: Payer: Self-pay | Admitting: Obstetrics & Gynecology

## 2013-02-28 ENCOUNTER — Ambulatory Visit (INDEPENDENT_AMBULATORY_CARE_PROVIDER_SITE_OTHER): Payer: BC Managed Care – PPO | Admitting: Obstetrics & Gynecology

## 2013-02-28 VITALS — BP 150/93 | HR 101 | Resp 16 | Ht 66.0 in | Wt 242.0 lb

## 2013-02-28 DIAGNOSIS — N920 Excessive and frequent menstruation with regular cycle: Secondary | ICD-10-CM

## 2013-02-28 DIAGNOSIS — N921 Excessive and frequent menstruation with irregular cycle: Secondary | ICD-10-CM

## 2013-02-28 MED ORDER — NORGESTIM-ETH ESTRAD TRIPHASIC 0.18/0.215/0.25 MG-35 MCG PO TABS: 1.0000 | ORAL_TABLET | Freq: Every day | ORAL | Status: DC

## 2013-02-28 NOTE — Progress Notes (Signed)
Patient ID: Bethany Richardson, female   DOB: 1971/02/28, 42 y.o.   MRN: [id number]  Labs reviewed, only mild elevation of free testosterone. Patient's last menstrual period was 02/12/2013.   Filed Vitals:   02/28/13 0926  Height: 5\' 6"  (1.676 m)  Weight: 242 lb (109.77 kg)    Some bleeding now, will continue OCP. RTC 4 months.  Adam Phenix, MD 02/28/2013

## 2013-03-23 ENCOUNTER — Other Ambulatory Visit: Payer: Self-pay

## 2013-05-04 ENCOUNTER — Telehealth: Payer: Self-pay | Admitting: Family Medicine

## 2013-05-04 NOTE — Telephone Encounter (Signed)
Appts for family scheduled.

## 2013-05-08 ENCOUNTER — Ambulatory Visit: Payer: BC Managed Care – PPO | Admitting: Family Medicine

## 2013-09-18 ENCOUNTER — Other Ambulatory Visit (HOSPITAL_COMMUNITY): Payer: Self-pay | Admitting: Physician Assistant

## 2013-09-18 DIAGNOSIS — Z1231 Encounter for screening mammogram for malignant neoplasm of breast: Secondary | ICD-10-CM

## 2013-09-19 ENCOUNTER — Ambulatory Visit (HOSPITAL_COMMUNITY)
Admission: RE | Admit: 2013-09-19 | Discharge: 2013-09-19 | Disposition: A | Discharge status: Home / Self Care | Payer: BC Managed Care – PPO | Source: Ambulatory Visit | Attending: Physician Assistant | Admitting: Physician Assistant

## 2013-09-19 DIAGNOSIS — Z1231 Encounter for screening mammogram for malignant neoplasm of breast: Secondary | ICD-10-CM

## 2013-12-05 ENCOUNTER — Encounter: Payer: Self-pay | Admitting: *Deleted

## 2013-12-25 ENCOUNTER — Encounter: Payer: BC Managed Care – PPO | Admitting: Obstetrics and Gynecology

## 2014-01-02 ENCOUNTER — Encounter: Payer: BC Managed Care – PPO | Admitting: Obstetrics & Gynecology

## 2014-01-04 ENCOUNTER — Encounter: Payer: Self-pay | Admitting: Obstetrics and Gynecology

## 2014-01-08 ENCOUNTER — Encounter: Payer: BC Managed Care – PPO | Admitting: Obstetrics and Gynecology

## 2014-03-19 ENCOUNTER — Encounter: Payer: Self-pay | Admitting: *Deleted

## 2014-05-22 ENCOUNTER — Encounter: Payer: Self-pay | Admitting: Obstetrics & Gynecology

## 2014-05-22 ENCOUNTER — Ambulatory Visit (INDEPENDENT_AMBULATORY_CARE_PROVIDER_SITE_OTHER): Payer: PRIVATE HEALTH INSURANCE | Admitting: Obstetrics & Gynecology

## 2014-05-22 VITALS — BP 118/90 | Ht 67.0 in | Wt 261.0 lb

## 2014-05-22 DIAGNOSIS — R102 Pelvic and perineal pain: Secondary | ICD-10-CM

## 2014-05-22 DIAGNOSIS — N920 Excessive and frequent menstruation with regular cycle: Secondary | ICD-10-CM

## 2014-05-22 DIAGNOSIS — R739 Hyperglycemia, unspecified: Secondary | ICD-10-CM

## 2014-05-22 DIAGNOSIS — N946 Dysmenorrhea, unspecified: Secondary | ICD-10-CM

## 2014-05-22 MED ORDER — METRONIDAZOLE 500 MG PO TABS: 500.0000 mg | ORAL_TABLET | Freq: Two times a day (BID) | ORAL | Status: DC

## 2014-05-22 MED ORDER — MEGESTROL ACETATE 40 MG PO TABS: 40.0000 mg | ORAL_TABLET | Freq: Every day | ORAL | Status: DC

## 2014-05-22 NOTE — Progress Notes (Addendum)
Patient ID: Bethany Richardson, female   DOB: 1970/12/12, 44 y.o.   MRN: [id number] Chief Complaint  Patient presents with  . Referral    free clinic. adenomyosis and menorrhagia.    HPI Pt is seen after being told at [facility] that the reason for her heavy and painful periods was adenomyosis.  No sonogram has been done regarding this.  Her periods are regular heavy and painful No dyspareunia   ROS No burning with urination, frequency or urgency No nausea, vomiting or diarrhea Nor fever chills or other constitutional symptoms   Blood pressure 118/90, height 5' 7"  (1.702 m), weight 261 lb (118.389 kg).  EXAM Abdomen:      soft, obese, nontender Vulva:            normal appearing vulva with no masses, tenderness or lesions Vagina:          normal mucosa, no discharge Cervix:           normal appearance Uterus:          uterus is normal size, shape, consistency and nontender Adnexa:         normal adnexa in size, nontender and no masses Rectal:            Hemoccult:                               Assessment/Plan:  Menorrhagia Dysmenorrhea  Megestrol course and pelvic sonogram in 1 month Follow up 1 month to see how she responded  Past Medical History  Diagnosis Date  . GERD (gastroesophageal reflux disease)   . Adenomyosis   . Obesity   . Depression   . Pneumonia 2013  . Hypertension   . Chronic bronchitis   . Menorrhagia     Past Surgical History  Procedure Laterality Date  . Tubal ligation Bilateral 1995    OB History    Gravida Para Term Preterm AB TAB SAB Ectopic Multiple Living   2         2      No Known Allergies  History   Social History  . Marital Status: Married    Spouse Name: N/A    Number of Children: N/A  . Years of Education: N/A   Social History Main Topics  . Smoking status: Former Smoker    Quit date: 01/31/2009  . Smokeless tobacco: Never Used  . Alcohol Use: No  . Drug Use: No  . Sexual Activity:    Partners: Male   Other  Topics Concern  . None   Social History Narrative    Family History  Problem Relation Age of Onset  . Hypertension Father   . Hypertension Mother

## 2014-05-23 LAB — HEMOGLOBIN A1C
Hgb A1c MFr Bld: 6.4 % — ABNORMAL HIGH (ref <5.7)
MEAN PLASMA GLUCOSE: 137 mg/dL — AB (ref <117)

## 2014-05-31 ENCOUNTER — Telehealth: Payer: Self-pay | Admitting: Obstetrics & Gynecology

## 2014-05-31 NOTE — Telephone Encounter (Signed)
Pt informed of 6.4 A1C results will discuss at pt follow up appt with Dr. Elonda Husky.

## 2014-06-20 ENCOUNTER — Telehealth: Payer: Self-pay | Admitting: Obstetrics & Gynecology

## 2014-06-20 MED ORDER — MEGESTROL ACETATE 40 MG PO TABS: 40.0000 mg | ORAL_TABLET | Freq: Every day | ORAL | Status: DC

## 2014-06-20 NOTE — Telephone Encounter (Signed)
Pt states she has one of the megace tablets left, she is no longer having vaginal bleeding. Does she need to get a refill on the Megace to continue to take?

## 2014-06-20 NOTE — Telephone Encounter (Signed)
Pt informed per Dr. Elonda Husky "not to stop Megace." Pt aware refills for Megace are at her pharmacy. Pt verbalized understanding.

## 2014-06-25 ENCOUNTER — Encounter: Payer: Self-pay | Admitting: Obstetrics & Gynecology

## 2014-06-25 ENCOUNTER — Ambulatory Visit (INDEPENDENT_AMBULATORY_CARE_PROVIDER_SITE_OTHER): Payer: PRIVATE HEALTH INSURANCE

## 2014-06-25 ENCOUNTER — Other Ambulatory Visit: Payer: Self-pay | Admitting: Obstetrics & Gynecology

## 2014-06-25 ENCOUNTER — Ambulatory Visit (INDEPENDENT_AMBULATORY_CARE_PROVIDER_SITE_OTHER): Payer: PRIVATE HEALTH INSURANCE | Admitting: Obstetrics & Gynecology

## 2014-06-25 VITALS — BP 140/80 | Wt 259.0 lb

## 2014-06-25 DIAGNOSIS — N920 Excessive and frequent menstruation with regular cycle: Secondary | ICD-10-CM

## 2014-06-25 DIAGNOSIS — R102 Pelvic and perineal pain: Secondary | ICD-10-CM

## 2014-06-25 DIAGNOSIS — N946 Dysmenorrhea, unspecified: Secondary | ICD-10-CM

## 2014-06-25 MED ORDER — SILVER SULFADIAZINE 1 % EX CREA: TOPICAL_CREAM | CUTANEOUS | Status: DC

## 2014-06-25 NOTE — Progress Notes (Signed)
Patient ID: JEANNA GIUFFRE, female   DOB: 1971/04/22, 44 y.o.   MRN: [id number] US Transvaginal Non-ob  06/25/2014   GYNECOLOGIC SONOGRAM   YARNELL KOZLOSKI is a 44 y.o. G2 for a pelvic sonogram for pelvic pain,  dysmenorrhea, menorrhagia.  Uterus                      10.2 x 6.3 x 5.3 cm, anteverted   Endometrium          17.3 mm, inhomogeneous appearance no obvious solitary  mass noted within   Right ovary             1.7 x 2.4 x 1.5 cm,   Left ovary                2.5 x 2.2 x 2.1 cm,   No free fluid noted within the pelvis  Technician Comments:  Anteverted uterus, Endometrium-inhomogeneous appearance, bilateral  adnexa/ovaries appear WNL, no free fluid or adnexal masses noted within  the pelvis   Lazarus Gowda 06/25/2014 10:55 AM   Clinical Impression and recommendations:  I have reviewed the sonogram results above, combined with the patient's  current clinical course, below are my impressions and any appropriate  recommendations for management based on the sonographic findings.  Normal gyn sonogram with endometrium thickened du to megestrol therapy   [name],[name] 06/25/2014 11:17 AM    Pt will decide regarding her continued megace vs ablation She will call back Follow up prn, ball is in her court

## 2014-07-09 ENCOUNTER — Telehealth: Payer: Self-pay | Admitting: *Deleted

## 2014-07-09 ENCOUNTER — Telehealth: Payer: Self-pay | Admitting: Obstetrics & Gynecology

## 2014-07-09 NOTE — Telephone Encounter (Signed)
Spoke with pt. Pt is on Megace and started bleeding Saturday afternoon. Pt states she is unsure if she needs to go ahead and have surgery. Please advise. Thanks!! Adams

## 2014-07-09 NOTE — Telephone Encounter (Signed)
Pt called and left cell # for [place] to call her on. Encounter closed. [place]

## 2014-07-12 ENCOUNTER — Telehealth: Payer: Self-pay | Admitting: Obstetrics & Gynecology

## 2014-07-12 NOTE — Telephone Encounter (Signed)
Pt informed to continue the Megace 80 mg po daily per Dr. Elonda Husky. Pt verbalized understanding.

## 2014-07-12 NOTE — Telephone Encounter (Signed)
Pt states unable to have surgery due to out of pocket expense will need to cancel.  Does pt need to continue the Megace 80 mg daily for vaginal bleeding?  Pt states "having light bleeding now but not bad."

## 2014-07-12 NOTE — Telephone Encounter (Signed)
Noted from Amy will re schedule after insurance lkicks in

## 2014-07-13 ENCOUNTER — Inpatient Hospital Stay (HOSPITAL_COMMUNITY)
Admission: RE | Admit: 2014-07-13 | Discharge: 2014-07-13 | Disposition: A | Discharge status: Home / Self Care | Payer: Self-pay | Source: Ambulatory Visit

## 2014-07-18 ENCOUNTER — Ambulatory Visit (HOSPITAL_COMMUNITY)
Admission: RE | Admit: 2014-07-18 | Payer: No Typology Code available for payment source | Source: Ambulatory Visit | Admitting: Obstetrics & Gynecology

## 2014-07-18 ENCOUNTER — Encounter (HOSPITAL_COMMUNITY): Admission: RE | Payer: Self-pay | Source: Ambulatory Visit

## 2014-07-18 SURGERY — DILATATION & CURETTAGE/HYSTEROSCOPY WITH NOVASURE ABLATION
Anesthesia: General

## 2014-07-25 ENCOUNTER — Encounter: Payer: PRIVATE HEALTH INSURANCE | Admitting: Obstetrics & Gynecology

## 2014-07-31 ENCOUNTER — Telehealth: Payer: Self-pay | Admitting: Obstetrics & Gynecology

## 2014-07-31 MED ORDER — METRONIDAZOLE 500 MG PO TABS
500.0000 mg | ORAL_TABLET | Freq: Two times a day (BID) | ORAL | Status: DC
Start: 2014-07-31 — End: 2014-10-19

## 2014-07-31 NOTE — Telephone Encounter (Signed)
Pt states taking the megace 40 mg two tablet daily as prescribed by Dr. Elonda Husky continues to have vaginal bleeding off and on with cramping and at times there is an odor. Pt states she plans to have the endometrial ablation when her insurance goes into affect.

## 2014-07-31 NOTE — Telephone Encounter (Signed)
Pt informed Flagyl prescribed and to continue to take the Megace as Dr. Elonda Husky e-scribed. Pt verbalized understanding.

## 2014-08-28 ENCOUNTER — Ambulatory Visit: Payer: PRIVATE HEALTH INSURANCE | Admitting: Obstetrics & Gynecology

## 2014-09-12 ENCOUNTER — Telehealth: Payer: Self-pay | Admitting: Family

## 2014-09-12 ENCOUNTER — Encounter: Payer: Self-pay | Admitting: Family

## 2014-09-12 ENCOUNTER — Ambulatory Visit (INDEPENDENT_AMBULATORY_CARE_PROVIDER_SITE_OTHER): Payer: 59

## 2014-09-12 ENCOUNTER — Ambulatory Visit (INDEPENDENT_AMBULATORY_CARE_PROVIDER_SITE_OTHER): Payer: 59 | Admitting: Family

## 2014-09-12 VITALS — BP 135/87 | HR 83 | Temp 97.0°F | Ht 67.0 in | Wt 246.0 lb

## 2014-09-12 DIAGNOSIS — N939 Abnormal uterine and vaginal bleeding, unspecified: Secondary | ICD-10-CM | POA: Diagnosis not present

## 2014-09-12 DIAGNOSIS — R202 Paresthesia of skin: Secondary | ICD-10-CM | POA: Diagnosis not present

## 2014-09-12 DIAGNOSIS — M25552 Pain in left hip: Secondary | ICD-10-CM

## 2014-09-12 DIAGNOSIS — M5430 Sciatica, unspecified side: Secondary | ICD-10-CM | POA: Diagnosis not present

## 2014-09-12 MED ORDER — KETOROLAC TROMETHAMINE 60 MG/2ML IM SOLN
60.0000 mg | Freq: Once | INTRAMUSCULAR | Status: AC
  Administered 2014-09-12: 60 mg via INTRAMUSCULAR

## 2014-09-12 MED ORDER — METHYLPREDNISOLONE ACETATE 80 MG/ML IJ SUSP
80.0000 mg | Freq: Once | INTRAMUSCULAR | Status: AC
  Administered 2014-09-12: 80 mg via INTRAMUSCULAR

## 2014-09-12 MED ORDER — MELOXICAM 15 MG PO TABS: 15.0000 mg | ORAL_TABLET | Freq: Every day | ORAL | Status: DC

## 2014-09-12 NOTE — Progress Notes (Signed)
Subjective:    Patient ID: Bethany Richardson, female    DOB: Oct 01, 1970, 44 y.o.   MRN: [id number]  Leg Pain  The incident occurred more than 1 week ago. There was no injury mechanism. The pain is present in the left knee, left thigh and left hip. The quality of the pain is described as burning and shooting. The pain is at a severity of 7/10. The pain is moderate. The pain has been intermittent since onset. Associated symptoms include a loss of sensation, numbness and tingling. Pertinent negatives include no loss of motion. The symptoms are aggravated by movement. She has tried acetaminophen and rest (muscle relaxer) for the symptoms. The treatment provided mild relief.      Review of Systems  Constitutional: Negative.   HENT: Negative.   Eyes: Negative.   Respiratory: Negative.  Negative for shortness of breath.   Cardiovascular: Negative.  Negative for palpitations.  Gastrointestinal: Negative.   Endocrine: Negative.   Genitourinary: Negative.   Musculoskeletal: Negative.   Neurological: Positive for tingling and numbness. Negative for headaches.  Hematological: Negative.   Psychiatric/Behavioral: Negative.   All other systems reviewed and are negative.      Objective:   Physical Exam  Constitutional: She is oriented to person, place, and time. She appears well-developed and well-nourished. No distress.  HENT:  Head: Normocephalic and atraumatic.  Right Ear: External ear normal.  Left Ear: External ear normal.  Nose: Nose normal.  Mouth/Throat: Oropharynx is clear and moist.  Eyes: Pupils are equal, round, and reactive to light.  Neck: Normal range of motion. Neck supple. No thyromegaly present.  Cardiovascular: Normal rate, regular rhythm, normal heart sounds and intact distal pulses.   No murmur heard. Pulmonary/Chest: Effort normal and breath sounds normal. No respiratory distress. She has no wheezes.  Abdominal: Soft. Bowel sounds are normal. She exhibits no distension.  There is no tenderness.  Musculoskeletal: Normal range of motion. She exhibits no edema or tenderness.  Neurological: She is alert and oriented to person, place, and time. She has normal reflexes. No cranial nerve deficit.  Skin: Skin is warm and dry.  Psychiatric: She has a normal mood and affect. Her behavior is normal. Judgment and thought content normal.  Vitals reviewed.  X-ray- WNL Preliminary reading by Evelina Dun, FNP [facility]    BP 135/87 mmHg  Pulse 83  Temp(Src) 97 F (36.1 C) (Oral)  Ht 5' 7"  (1.702 m)  Wt 246 lb (111.585 kg)  BMI 38.52 kg/m2  LMP  (Approximate)     Assessment & Plan:  1. Left hip pain - DG HIP UNILAT WITH PELVIS 2-3 VIEWS LEFT; Future  2. Paresthesia of both hands - ketorolac (TORADOL) injection 60 mg; Inject 2 mLs (60 mg total) into the muscle once. - methylPREDNISolone acetate (DEPO-MEDROL) injection 80 mg; Inject 1 mL (80 mg total) into the muscle once. - meloxicam (MOBIC) 15 MG tablet; Take 1 tablet (15 mg total) by mouth daily.  Dispense: 30 tablet; Refill: 0  3. Abnormal uterine bleeding - Ambulatory referral to Obstetrics / Gynecology  4. Sciatic leg pain -Rest -Ice and heat as needed -No other NSAID's while taking Mobic -RTO prn - ketorolac (TORADOL) injection 60 mg; Inject 2 mLs (60 mg total) into the muscle once. - methylPREDNISolone acetate (DEPO-MEDROL) injection 80 mg; Inject 1 mL (80 mg total) into the muscle once. - meloxicam (MOBIC) 15 MG tablet; Take 1 tablet (15 mg total) by mouth daily.  Dispense: 30 tablet; Refill: 0  Evelina Dun, FNP

## 2014-09-12 NOTE — Patient Instructions (Signed)
Sciatica Sciatica is pain, weakness, numbness, or tingling along the path of the sciatic nerve. The nerve starts in the lower back and runs down the back of each leg. The nerve controls the muscles in the lower leg and in the back of the knee, while also providing sensation to the back of the thigh, lower leg, and the sole of your foot. Sciatica is a symptom of another medical condition. For instance, nerve damage or certain conditions, such as a herniated disk or bone spur on the spine, pinch or put pressure on the sciatic nerve. This causes the pain, weakness, or other sensations normally associated with sciatica. Generally, sciatica only affects one side of the body. CAUSES   Herniated or slipped disc.  Degenerative disk disease.  A pain disorder involving the narrow muscle in the buttocks (piriformis syndrome).  Pelvic injury or fracture.  Pregnancy.  Tumor (rare). SYMPTOMS  Symptoms can vary from mild to very severe. The symptoms usually travel from the low back to the buttocks and down the back of the leg. Symptoms can include:  Mild tingling or dull aches in the lower back, leg, or hip.  Numbness in the back of the calf or sole of the foot.  Burning sensations in the lower back, leg, or hip.  Sharp pains in the lower back, leg, or hip.  Leg weakness.  Severe back pain inhibiting movement. These symptoms may get worse with coughing, sneezing, laughing, or prolonged sitting or standing. Also, being overweight may worsen symptoms. DIAGNOSIS  Your caregiver will perform a physical exam to look for common symptoms of sciatica. He or she may ask you to do certain movements or activities that would trigger sciatic nerve pain. Other tests may be performed to find the cause of the sciatica. These may include:  Blood tests.  X-rays.  Imaging tests, such as an MRI or CT scan. TREATMENT  Treatment is directed at the cause of the sciatic pain. Sometimes, treatment is not necessary  and the pain and discomfort goes away on its own. If treatment is needed, your caregiver may suggest:  Over-the-counter medicines to relieve pain.  Prescription medicines, such as anti-inflammatory medicine, muscle relaxants, or narcotics.  Applying heat or ice to the painful area.  Steroid injections to lessen pain, irritation, and inflammation around the nerve.  Reducing activity during periods of pain.  Exercising and stretching to strengthen your abdomen and improve flexibility of your spine. Your caregiver may suggest losing weight if the extra weight makes the back pain worse.  Physical therapy.  Surgery to eliminate what is pressing or pinching the nerve, such as a bone spur or part of a herniated disk. HOME CARE INSTRUCTIONS   Only take over-the-counter or prescription medicines for pain or discomfort as directed by your caregiver.  Apply ice to the affected area for 20 minutes, 3-4 times a day for the first 48-72 hours. Then try heat in the same way.  Exercise, stretch, or perform your usual activities if these do not aggravate your pain.  Attend physical therapy sessions as directed by your caregiver.  Keep all follow-up appointments as directed by your caregiver.  Do not wear high heels or shoes that do not provide proper support.  Check your mattress to see if it is too soft. A firm mattress may lessen your pain and discomfort. SEEK IMMEDIATE MEDICAL CARE IF:   You lose control of your bowel or bladder (incontinence).  You have increasing weakness in the lower back, pelvis, buttocks,  or legs.  You have redness or swelling of your back.  You have a burning sensation when you urinate.  You have pain that gets worse when you lie down or awakens you at night.  Your pain is worse than you have experienced in the past.  Your pain is lasting longer than 4 weeks.  You are suddenly losing weight without reason. MAKE SURE YOU:  Understand these  instructions.  Will watch your condition.  Will get help right away if you are not doing well or get worse. Document Released: 04/28/2001 Document Revised: 11/03/2011 Document Reviewed: 09/13/2011 [facility] Patient Information 2015 [place], [place]. This information is not intended to replace advice given to you by your health care provider. Make sure you discuss any questions you have with your health care provider.

## 2014-09-12 NOTE — Telephone Encounter (Signed)
Mobic 15 mg was sent to Bethany Richardson

## 2014-09-13 NOTE — Telephone Encounter (Signed)
Left detailed mssg that Rx for Mobic, an anti-inflammatory was sent in to [facility].

## 2014-09-25 ENCOUNTER — Ambulatory Visit: Payer: PRIVATE HEALTH INSURANCE | Admitting: Family Medicine

## 2014-10-01 ENCOUNTER — Ambulatory Visit (INDEPENDENT_AMBULATORY_CARE_PROVIDER_SITE_OTHER): Payer: 59 | Admitting: Obstetrics & Gynecology

## 2014-10-01 ENCOUNTER — Encounter: Payer: Self-pay | Admitting: Obstetrics & Gynecology

## 2014-10-01 VITALS — BP 140/80 | HR 80 | Wt 251.0 lb

## 2014-10-01 DIAGNOSIS — N920 Excessive and frequent menstruation with regular cycle: Secondary | ICD-10-CM | POA: Diagnosis not present

## 2014-10-01 DIAGNOSIS — N946 Dysmenorrhea, unspecified: Secondary | ICD-10-CM | POA: Diagnosis not present

## 2014-10-01 MED ORDER — KETOROLAC TROMETHAMINE 10 MG PO TABS: 10.0000 mg | ORAL_TABLET | Freq: Three times a day (TID) | ORAL | Status: DC | PRN

## 2014-10-01 MED ORDER — HYDROCODONE-ACETAMINOPHEN 5-325 MG PO TABS: 1.0000 | ORAL_TABLET | Freq: Four times a day (QID) | ORAL | Status: DC | PRN

## 2014-10-01 MED ORDER — ONDANSETRON HCL 8 MG PO TABS: 8.0000 mg | ORAL_TABLET | Freq: Three times a day (TID) | ORAL | Status: DC | PRN

## 2014-10-01 NOTE — Progress Notes (Signed)
Patient ID: Bethany Richardson, female   DOB: May 29, 1970, 44 y.o.   MRN: [id number] Preoperative History and Physical  Bethany Richardson is a 44 y.o. G2P0 with No LMP recorded (approximate). Patient has had an injection. admitted for a hysteroscopy uterine curettage endometrial ablation for menometrorrhagia and dysmenorrhea.  Has responded to megestrol sonogram no signficant endometrial pathology  PMH:    Past Medical History  Diagnosis Date  . GERD (gastroesophageal reflux disease)   . Adenomyosis   . Obesity   . Depression   . Pneumonia 2013  . Hypertension   . Chronic bronchitis   . Menorrhagia     PSH:     Past Surgical History  Procedure Laterality Date  . Tubal ligation Bilateral 1995    POb/GynH:      OB History    Gravida Para Term Preterm AB TAB SAB Ectopic Multiple Living   2         2      SH:   History  Substance Use Topics  . Smoking status: Former Smoker    Quit date: 01/31/2009  . Smokeless tobacco: Never Used  . Alcohol Use: No    FH:    Family History  Problem Relation Age of Onset  . Hypertension Father   . Hypertension Mother      Allergies: No Known Allergies  Medications:       Current outpatient prescriptions:  .  hydrochlorothiazide (HYDRODIURIL) 25 MG tablet, Take 25 mg by mouth daily., Disp: , Rfl:  .  megestrol (MEGACE) 40 MG tablet, Take 1 tablet (40 mg total) by mouth daily. (Patient taking differently: Take 80 mg by mouth daily. ), Disp: 30 tablet, Rfl: 11 .  ranitidine (ZANTAC) 150 MG capsule, Take 150 mg by mouth daily. , Disp: , Rfl:  .  meloxicam (MOBIC) 15 MG tablet, Take 1 tablet (15 mg total) by mouth daily. (Patient not taking: Reported on 10/01/2014), Disp: 30 tablet, Rfl: 0 .  metroNIDAZOLE (FLAGYL) 500 MG tablet, Take 1 tablet (500 mg total) by mouth 2 (two) times daily. (Patient not taking: Reported on 05/22/2014), Disp: 14 tablet, Rfl: 0 .  metroNIDAZOLE (FLAGYL) 500 MG tablet, Take 1 tablet (500 mg total) by mouth 2 (two)  times daily. (Patient not taking: Reported on 06/25/2014), Disp: 14 tablet, Rfl: 0 .  metroNIDAZOLE (FLAGYL) 500 MG tablet, Take 1 tablet (500 mg total) by mouth 2 (two) times daily. (Patient not taking: Reported on 10/01/2014), Disp: 14 tablet, Rfl: 0 .  naproxen (NAPROSYN) 500 MG tablet, Take 1 tablet (500 mg total) by mouth 2 (two) times daily with a meal. (Patient not taking: Reported on 05/22/2014), Disp: 60 tablet, Rfl: 0 .  nitroGLYCERIN (NITROSTAT) 0.4 MG SL tablet, Place 0.4 mg under the tongue every 5 (five) minutes as needed., Disp: , Rfl:  .  Norgestimate-Ethinyl Estradiol Triphasic (TRI-SPRINTEC) 0.18/0.215/0.25 MG-35 MCG tablet, Take 1 tablet by mouth daily. (Patient not taking: Reported on 05/22/2014), Disp: 1 Package, Rfl: 12 .  pantoprazole (PROTONIX) 40 MG tablet, Take 1 tablet (40 mg total) by mouth daily. (Patient not taking: Reported on 05/22/2014), Disp: 30 tablet, Rfl: 3 .  silver sulfADIAZINE (SILVADENE) 1 % cream, Use 3 times per day (Patient not taking: Reported on 10/01/2014), Disp: 50 g, Rfl: 11  Review of Systems:   Review of Systems  Constitutional: Negative for fever, chills, weight loss, malaise/fatigue and diaphoresis.  HENT: Negative for hearing loss, ear pain, nosebleeds, congestion, sore throat, neck pain, tinnitus  and ear discharge.   Eyes: Negative for blurred vision, double vision, photophobia, pain, discharge and redness.  Respiratory: Negative for cough, hemoptysis, sputum production, shortness of breath, wheezing and stridor.   Cardiovascular: Negative for chest pain, palpitations, orthopnea, claudication, leg swelling and PND.  Gastrointestinal: Positive for abdominal pain. Negative for heartburn, nausea, vomiting, diarrhea, constipation, blood in stool and melena.  Genitourinary: Negative for dysuria, urgency, frequency, hematuria and flank pain.  Musculoskeletal: Negative for myalgias, back pain, joint pain and falls.  Skin: Negative for itching and rash.   Neurological: Negative for dizziness, tingling, tremors, sensory change, speech change, focal weakness, seizures, loss of consciousness, weakness and headaches.  Endo/Heme/Allergies: Negative for environmental allergies and polydipsia. Does not bruise/bleed easily.  Psychiatric/Behavioral: Negative for depression, suicidal ideas, hallucinations, memory loss and substance abuse. The patient is not nervous/anxious and does not have insomnia.      PHYSICAL EXAM:  Blood pressure 140/80, pulse 80, weight 251 lb (113.853 kg).    Vitals reviewed. Constitutional: She is oriented to person, place, and time. She appears well-developed and well-nourished.  HENT:  Head: Normocephalic and atraumatic.  Right Ear: External ear normal.  Left Ear: External ear normal.  Nose: Nose normal.  Mouth/Throat: Oropharynx is clear and moist.  Eyes: Conjunctivae and EOM are normal. Pupils are equal, round, and reactive to light. Right eye exhibits no discharge. Left eye exhibits no discharge. No scleral icterus.  Neck: Normal range of motion. Neck supple. No tracheal deviation present. No thyromegaly present.  Cardiovascular: Normal rate, regular rhythm, normal heart sounds and intact distal pulses.  Exam reveals no gallop and no friction rub.   No murmur heard. Respiratory: Effort normal and breath sounds normal. No respiratory distress. She has no wheezes. She has no rales. She exhibits no tenderness.  GI: Soft. Bowel sounds are normal. She exhibits no distension and no mass. There is tenderness. There is no rebound and no guarding.  Genitourinary:       Vulva is normal without lesions Vagina is pink moist without discharge Cervix normal in appearance and pap is normal Uterus is normal size, contour, position, consistency, mobility, non-tender Adnexa is negative with normal sized ovaries by sonogram  Musculoskeletal: Normal range of motion. She exhibits no edema and no tenderness.  Neurological: She is  alert and oriented to person, place, and time. She has normal reflexes. She displays normal reflexes. No cranial nerve deficit. She exhibits normal muscle tone. Coordination normal.  Skin: Skin is warm and dry. No rash noted. No erythema. No pallor.  Psychiatric: She has a normal mood and affect. Her behavior is normal. Judgment and thought content normal.    Labs: No results found for this or any previous visit (from the past 336 hour(s)).  EKG: No orders found for this or any previous visit.  Imaging Studies: Dg Hip Unilat With Pelvis 2-3 Views Left  09/12/2014   CLINICAL DATA:  44 year old female with a history of left hip pain. No acute injury.  EXAM: LEFT HIP (WITH PELVIS) 2-3 VIEWS  COMPARISON:  None.  FINDINGS: Bony pelvic ring appears intact. No acute bony abnormality. Bilateral hips projects normally over the acetabula. Unremarkable appearance the proximal visualized femurs. Left hip appears congruent.  Mild changes of osteoarthritis.  IMPRESSION: No radiographic evidence of acute bony abnormality.  Mild osteoarthritis bilateral hips.  Signed,  Dulcy Fanny. Earleen Newport, DO  [facility]  [facility]   Electronically Signed   By: Corrie Mckusick D.O.  On: 09/12/2014 16:58      Assessment: Menometrorrhagia Dysmenorrhea  Patient Active Problem List   Diagnosis Date Noted  . Sore throat 02/02/2013  . Dysmenorrhea 01/31/2013  . Menorrhagia with irregular cycle 01/31/2013  . Dyspareunia 12/22/2012  . Costochondritis 12/22/2012  . GERD (gastroesophageal reflux disease) 12/22/2012  . Abdominal pain, left lower quadrant 12/19/2012    Plan: Hysteroscopy uterine curettage NovaSure endometrial ablation  Traycen Goyer H 10/01/2014 2:54 PM

## 2014-10-17 ENCOUNTER — Encounter (HOSPITAL_COMMUNITY)
Admission: RE | Admit: 2014-10-17 | Discharge: 2014-10-17 | Disposition: A | Discharge status: Home / Self Care | Payer: 59 | Source: Ambulatory Visit | Attending: Obstetrics & Gynecology | Admitting: Obstetrics & Gynecology

## 2014-10-17 ENCOUNTER — Encounter (HOSPITAL_COMMUNITY): Payer: Self-pay

## 2014-10-17 DIAGNOSIS — Z6841 Body Mass Index (BMI) 40.0 and over, adult: Secondary | ICD-10-CM | POA: Diagnosis not present

## 2014-10-17 DIAGNOSIS — K219 Gastro-esophageal reflux disease without esophagitis: Secondary | ICD-10-CM | POA: Diagnosis not present

## 2014-10-17 DIAGNOSIS — I10 Essential (primary) hypertension: Secondary | ICD-10-CM | POA: Diagnosis not present

## 2014-10-17 DIAGNOSIS — N946 Dysmenorrhea, unspecified: Secondary | ICD-10-CM | POA: Diagnosis present

## 2014-10-17 DIAGNOSIS — F329 Major depressive disorder, single episode, unspecified: Secondary | ICD-10-CM | POA: Diagnosis not present

## 2014-10-17 DIAGNOSIS — Z87891 Personal history of nicotine dependence: Secondary | ICD-10-CM | POA: Diagnosis not present

## 2014-10-17 DIAGNOSIS — Z9851 Tubal ligation status: Secondary | ICD-10-CM | POA: Diagnosis not present

## 2014-10-17 DIAGNOSIS — E669 Obesity, unspecified: Secondary | ICD-10-CM | POA: Diagnosis not present

## 2014-10-17 DIAGNOSIS — J189 Pneumonia, unspecified organism: Secondary | ICD-10-CM | POA: Diagnosis not present

## 2014-10-17 DIAGNOSIS — N921 Excessive and frequent menstruation with irregular cycle: Secondary | ICD-10-CM | POA: Diagnosis not present

## 2014-10-17 LAB — COMPREHENSIVE METABOLIC PANEL
ALBUMIN: 3.5 g/dL (ref 3.5–5.0)
ALT: 20 U/L (ref 14–54)
AST: 15 U/L (ref 15–41)
Alkaline Phosphatase: 80 U/L (ref 38–126)
Anion gap: 9 (ref 5–15)
BUN: 14 mg/dL (ref 6–20)
CALCIUM: 8.7 mg/dL — AB (ref 8.9–10.3)
CO2: 24 mmol/L (ref 22–32)
Chloride: 106 mmol/L (ref 101–111)
Creatinine, Ser: 0.86 mg/dL (ref 0.44–1.00)
GFR calc non Af Amer: >60 mL/min (ref >60)
Glucose, Bld: 114 mg/dL — ABNORMAL HIGH (ref 65–99)
Potassium: 3.7 mmol/L (ref 3.5–5.1)
Sodium: 139 mmol/L (ref 135–145)
Total Bilirubin: 0.7 mg/dL (ref 0.3–1.2)
Total Protein: 6.5 g/dL (ref 6.5–8.1)

## 2014-10-17 LAB — HCG, QUANTITATIVE, PREGNANCY: hCG, Beta Chain, Quant, S: <1 m[IU]/mL (ref <5)

## 2014-10-17 LAB — URINALYSIS, ROUTINE W REFLEX MICROSCOPIC
Bilirubin Urine: NEGATIVE
Glucose, UA: NEGATIVE mg/dL
Hgb urine dipstick: NEGATIVE
KETONES UR: NEGATIVE mg/dL
LEUKOCYTES UA: NEGATIVE
Nitrite: NEGATIVE
Protein, ur: NEGATIVE mg/dL
Specific Gravity, Urine: 1.02 (ref 1.005–1.030)
Urobilinogen, UA: 0.2 mg/dL (ref 0.0–1.0)
pH: 6 (ref 5.0–8.0)

## 2014-10-17 LAB — CBC
HCT: 44 % (ref 36.0–46.0)
Hemoglobin: 14.4 g/dL (ref 12.0–15.0)
MCH: 26.1 pg (ref 26.0–34.0)
MCHC: 32.7 g/dL (ref 30.0–36.0)
MCV: 79.9 fL (ref 78.0–100.0)
PLATELETS: 197 10*3/uL (ref 150–400)
RBC: 5.51 MIL/uL — ABNORMAL HIGH (ref 3.87–5.11)
RDW: 13.8 % (ref 11.5–15.5)
WBC: 7.6 10*3/uL (ref 4.0–10.5)

## 2014-10-17 NOTE — Patient Instructions (Signed)
Bethany Richardson  10/17/2014     @PREFPERIOPPHARMACY @   Your procedure is scheduled on  10/19/2014  Report to [facility] at  615  A.M.  Call this number if you have problems the morning of surgery:  [phone]   Remember:  Do not eat food or drink liquids after midnight.  Take these medicines the morning of surgery with A SIP OF WATER  Norvasc, zantac   Do not wear jewelry, make-up or nail polish.  Do not wear lotions, powders, or perfumes.    Do not shave 48 hours prior to surgery.  Men may shave face and neck.  Do not bring valuables to the hospital.  [facility] is not responsible for any belongings or valuables.  Contacts, dentures or bridgework may not be worn into surgery.  Leave your suitcase in the car.  After surgery it may be brought to your room.  For patients admitted to the hospital, discharge time will be determined by your treatment team.  Patients discharged the day of surgery will not be allowed to drive home.   Name and phone number of your driver:   family Special instructions:  none Please read over the following fact sheets that you were given. Pain Booklet, Coughing and Deep Breathing, Surgical Site Infection Prevention, Anesthesia Post-op Instructions and Care and Recovery After Surgery      Endometrial Ablation Endometrial ablation removes the lining of the uterus (endometrium). It is usually a same-day, outpatient treatment. Ablation helps avoid major surgery, such as surgery to remove the cervix and uterus (hysterectomy). After endometrial ablation, you will have little or no menstrual bleeding and may not be able to have children. However, if you are premenopausal, you will need to use a reliable method of birth control following the procedure because of the small chance that pregnancy can occur. There are different reasons to have this procedure, which include:  Heavy periods.  Bleeding that is causing anemia.  Irregular  bleeding.  Bleeding fibroids on the lining inside the uterus if they are smaller than 3 centimeters. This procedure should not be done if:  You want children in the future.  You have severe cramps with your menstrual period.  You have precancerous or cancerous cells in your uterus.  You were recently pregnant.  You have gone through menopause.  You have had major surgery on the uterus, such as a cesarean delivery. LET [facility] CARE PROVIDER KNOW ABOUT:  Any allergies you have.  All medicines you are taking, including vitamins, herbs, eye drops, creams, and over-the-counter medicines.  Previous problems you or members of your family have had with the use of anesthetics.  Any blood disorders you have.  Previous surgeries you have had.  Medical conditions you have. RISKS AND COMPLICATIONS  Generally, this is a safe procedure. However, as with any procedure, complications can occur. Possible complications include:  Perforation of the uterus.  Bleeding.  Infection of the uterus, bladder, or vagina.  Injury to surrounding organs.  An air bubble to the lung (air embolus).  Pregnancy following the procedure.  Failure of the procedure to help the problem, requiring hysterectomy.  Decreased ability to diagnose cancer in the lining of the uterus. BEFORE THE PROCEDURE  The lining of the uterus must be tested to make sure there is no pre-cancerous or cancer cells present.  An ultrasound may be performed to look at the size of the uterus and to  check for abnormalities.  Medicines may be given to thin the lining of the uterus. PROCEDURE  During the procedure, your health care provider will use a tool called a resectoscope to help see inside your uterus. There are different ways to remove the lining of your uterus.   Radiofrequency - This method uses a radiofrequency-alternating electric current to remove the lining of the uterus.  Cryotherapy - This method uses extreme  cold to freeze the lining of the uterus.  Heated-Free Liquid - This method uses heated salt (saline) solution to remove the lining of the uterus.  Microwave - This method uses high-energy microwaves to heat up the lining of the uterus to remove it.  Thermal balloon - This method involves inserting a catheter with a balloon tip into the uterus. The balloon tip is filled with heated fluid to remove the lining of the uterus. AFTER THE PROCEDURE  After your procedure, do not have sexual intercourse or insert anything into your vagina until permitted by your health care provider. After the procedure, you may experience:  Cramps.  Vaginal discharge.  Frequent urination. Document Released: 03/13/2004 Document Revised: 01/04/2013 Document Reviewed: 10/05/2012 [facility] Patient Information 2015 [place], [place]. This information is not intended to replace advice given to you by your health care provider. Make sure you discuss any questions you have with your health care provider. Dilation and Curettage or Vacuum Curettage Dilation and curettage (D&C) and vacuum curettage are minor procedures. A D&C involves stretching (dilation) the cervix and scraping (curettage) the inside lining of the womb (uterus). During a D&C, tissue is gently scraped from the inside lining of the uterus. During a vacuum curettage, the lining and tissue in the uterus are removed with the use of gentle suction.  Curettage may be performed to either diagnose or treat a problem. As a diagnostic procedure, curettage is performed to examine tissues from the uterus. A diagnostic curettage may be performed for the following symptoms:   Irregular bleeding in the uterus.   Bleeding with the development of clots.   Spotting between menstrual periods.   Prolonged menstrual periods.   Bleeding after menopause.   No menstrual period (amenorrhea).   A change in size and shape of the uterus.  As a treatment procedure, curettage  may be performed for the following reasons:   Removal of an IUD (intrauterine device).   Removal of retained placenta after giving birth. Retained placenta can cause an infection or bleeding severe enough to require transfusions.   Abortion.   Miscarriage.   Removal of polyps inside the uterus.   Removal of uncommon types of noncancerous lumps (fibroids).  LET [facility] CARE PROVIDER KNOW ABOUT:   Any allergies you have.   All medicines you are taking, including vitamins, herbs, eye drops, creams, and over-the-counter medicines.   Previous problems you or members of your family have had with the use of anesthetics.   Any blood disorders you have.   Previous surgeries you have had.   Medical conditions you have. RISKS AND COMPLICATIONS  Generally, this is a safe procedure. However, as with any procedure, complications can occur. Possible complications include:  Excessive bleeding.   Infection of the uterus.   Damage to the cervix.   Development of scar tissue (adhesions) inside the uterus, later causing abnormal amounts of menstrual bleeding.   Complications from the general anesthetic, if a general anesthetic is used.   Putting a hole (perforation) in the uterus. This is rare.  BEFORE THE PROCEDURE  Eat and drink before the procedure only as directed by your health care provider.   Arrange for someone to take you home.  PROCEDURE  This procedure usually takes about 15-30 minutes.  You will be given one of the following:  A medicine that numbs the area in and around the cervix (local anesthetic).   A medicine to make you sleep through the procedure (general anesthetic).  You will lie on your back with your legs in stirrups.   A warm metal or plastic instrument (speculum) will be placed in your vagina to keep it open and to allow the health care provider to see the cervix.  There are two ways in which your cervix can be softened and  dilated. These include:   Taking a medicine.   Having thin rods (laminaria) inserted into your cervix.   A curved tool (curette) will be used to scrape cells from the inside lining of the uterus. In some cases, gentle suction is applied with the curette. The curette will then be removed.  AFTER THE PROCEDURE   You will rest in the recovery area until you are stable and are ready to go home.   You may feel sick to your stomach (nauseous) or throw up (vomit) if you were given a general anesthetic.   You may have a sore throat if a tube was placed in your throat during general anesthesia.   You may have light cramping and bleeding. This may last for 2 days to 2 weeks after the procedure.   Your uterus needs to make a new lining after the procedure. This may make your next period late. Document Released: 05/04/2005 Document Revised: 01/04/2013 Document Reviewed: 12/01/2012 [facility] Patient Information 2015 [place], [place]. This information is not intended to replace advice given to you by your health care provider. Make sure you discuss any questions you have with your health care provider. Hysteroscopy Hysteroscopy is a procedure used for looking inside the womb (uterus). It may be done for various reasons, including:  To evaluate abnormal bleeding, fibroid (benign, noncancerous) tumors, polyps, scar tissue (adhesions), and possibly cancer of the uterus.  To look for lumps (tumors) and other uterine growths.  To look for causes of why a woman cannot get pregnant (infertility), causes of recurrent loss of pregnancy (miscarriages), or a lost intrauterine device (IUD).  To perform a sterilization by blocking the fallopian tubes from inside the uterus. In this procedure, a thin, flexible tube with a tiny light and camera on the end of it (hysteroscope) is used to look inside the uterus. A hysteroscopy should be done right after a menstrual period to be sure you are not pregnant. LET  [facility] CARE PROVIDER KNOW ABOUT:   Any allergies you have.  All medicines you are taking, including vitamins, herbs, eye drops, creams, and over-the-counter medicines.  Previous problems you or members of your family have had with the use of anesthetics.  Any blood disorders you have.  Previous surgeries you have had.  Medical conditions you have. RISKS AND COMPLICATIONS  Generally, this is a safe procedure. However, as with any procedure, complications can occur. Possible complications include:  Putting a hole in the uterus.  Excessive bleeding.  Infection.  Damage to the cervix.  Injury to other organs.  Allergic reaction to medicines.  Too much fluid used in the uterus for the procedure. BEFORE THE PROCEDURE   Ask your health care provider about changing or stopping any regular medicines.  Do not take  aspirin or blood thinners for 1 week before the procedure, or as directed by your health care provider. These can cause bleeding.  If you smoke, do not smoke for 2 weeks before the procedure.  In some cases, a medicine is placed in the cervix the day before the procedure. This medicine makes the cervix have a larger opening (dilate). This makes it easier for the instrument to be inserted into the uterus during the procedure.  Do not eat or drink anything for at least 8 hours before the surgery.  Arrange for someone to take you home after the procedure. PROCEDURE   You may be given a medicine to relax you (sedative). You may also be given one of the following:  A medicine that numbs the area around the cervix (local anesthetic).  A medicine that makes you sleep through the procedure (general anesthetic).  The hysteroscope is inserted through the vagina into the uterus. The camera on the hysteroscope sends a picture to a TV screen. This gives the surgeon a good view inside the uterus.  During the procedure, air or a liquid is put into the uterus, which allows  the surgeon to see better.  Sometimes, tissue is gently scraped from inside the uterus. These tissue samples are sent to a lab for testing. AFTER THE PROCEDURE   If you had a general anesthetic, you may be groggy for a couple hours after the procedure.  If you had a local anesthetic, you will be able to go home as soon as you are stable and feel ready.  You may have some cramping. This normally lasts for a couple days.  You may have bleeding, which varies from light spotting for a few days to menstrual-like bleeding for 3-7 days. This is normal.  If your test results are not back during the visit, make an appointment with your health care provider to find out the results. Document Released: 08/10/2000 Document Revised: 02/22/2013 Document Reviewed: 12/01/2012 [facility] Patient Information 2015 [place], [place]. This information is not intended to replace advice given to you by your health care provider. Make sure you discuss any questions you have with your health care provider. PATIENT INSTRUCTIONS POST-ANESTHESIA  IMMEDIATELY FOLLOWING SURGERY:  Do not drive or operate machinery for the first twenty four hours after surgery.  Do not make any important decisions for twenty four hours after surgery or while taking narcotic pain medications or sedatives.  If you develop intractable nausea and vomiting or a severe headache please notify your doctor immediately.  FOLLOW-UP:  Please make an appointment with your surgeon as instructed. You do not need to follow up with anesthesia unless specifically instructed to do so.  WOUND CARE INSTRUCTIONS (if applicable):  Keep a dry clean dressing on the anesthesia/puncture wound site if there is drainage.  Once the wound has quit draining you may leave it open to air.  Generally you should leave the bandage intact for twenty four hours unless there is drainage.  If the epidural site drains for more than 36-48 hours please call the anesthesia  department.  QUESTIONS?:  Please feel free to call your physician or the hospital operator if you have any questions, and they will be happy to assist you.

## 2014-10-19 ENCOUNTER — Encounter (HOSPITAL_COMMUNITY)
Admission: RE | Disposition: A | Discharge status: Home / Self Care | Payer: Self-pay | Source: Ambulatory Visit | Attending: Obstetrics & Gynecology

## 2014-10-19 ENCOUNTER — Ambulatory Visit (HOSPITAL_COMMUNITY): Payer: 59 | Admitting: Anesthesiology

## 2014-10-19 ENCOUNTER — Ambulatory Visit (HOSPITAL_COMMUNITY)
Admission: RE | Admit: 2014-10-19 | Discharge: 2014-10-19 | Disposition: A | Discharge status: Home / Self Care | Payer: 59 | Source: Ambulatory Visit | Attending: Obstetrics & Gynecology | Admitting: Obstetrics & Gynecology

## 2014-10-19 ENCOUNTER — Encounter (HOSPITAL_COMMUNITY): Payer: Self-pay | Admitting: *Deleted

## 2014-10-19 DIAGNOSIS — N921 Excessive and frequent menstruation with irregular cycle: Secondary | ICD-10-CM | POA: Insufficient documentation

## 2014-10-19 DIAGNOSIS — Z87891 Personal history of nicotine dependence: Secondary | ICD-10-CM | POA: Insufficient documentation

## 2014-10-19 DIAGNOSIS — J189 Pneumonia, unspecified organism: Secondary | ICD-10-CM | POA: Insufficient documentation

## 2014-10-19 DIAGNOSIS — K219 Gastro-esophageal reflux disease without esophagitis: Secondary | ICD-10-CM | POA: Insufficient documentation

## 2014-10-19 DIAGNOSIS — I10 Essential (primary) hypertension: Secondary | ICD-10-CM | POA: Insufficient documentation

## 2014-10-19 DIAGNOSIS — F329 Major depressive disorder, single episode, unspecified: Secondary | ICD-10-CM | POA: Insufficient documentation

## 2014-10-19 DIAGNOSIS — Z9851 Tubal ligation status: Secondary | ICD-10-CM | POA: Insufficient documentation

## 2014-10-19 DIAGNOSIS — N946 Dysmenorrhea, unspecified: Secondary | ICD-10-CM | POA: Insufficient documentation

## 2014-10-19 DIAGNOSIS — Z6841 Body Mass Index (BMI) 40.0 and over, adult: Secondary | ICD-10-CM | POA: Insufficient documentation

## 2014-10-19 DIAGNOSIS — E669 Obesity, unspecified: Secondary | ICD-10-CM | POA: Insufficient documentation

## 2014-10-19 DIAGNOSIS — N920 Excessive and frequent menstruation with regular cycle: Secondary | ICD-10-CM | POA: Diagnosis not present

## 2014-10-19 HISTORY — PX: HYSTEROSCOPY WITH NOVASURE: SHX5574

## 2014-10-19 SURGERY — HYSTEROSCOPY WITH NOVASURE
Anesthesia: General

## 2014-10-19 MED ORDER — GLYCOPYRROLATE 0.2 MG/ML IJ SOLN
0.2000 mg | Freq: Once | INTRAMUSCULAR | Status: AC
  Administered 2014-10-19: 0.2 mg via INTRAVENOUS

## 2014-10-19 MED ORDER — LIDOCAINE HCL (PF) 1 % IJ SOLN
INTRAMUSCULAR | Status: AC
  Filled 2014-10-19: qty 5

## 2014-10-19 MED ORDER — PROPOFOL 10 MG/ML IV BOLUS
INTRAVENOUS | Status: DC | PRN
  Administered 2014-10-19: 200 mg via INTRAVENOUS

## 2014-10-19 MED ORDER — LACTATED RINGERS IV SOLN
INTRAVENOUS | Status: DC
  Administered 2014-10-19: 07:00:00 via INTRAVENOUS

## 2014-10-19 MED ORDER — ROCURONIUM BROMIDE 100 MG/10ML IV SOLN
INTRAVENOUS | Status: DC | PRN
  Administered 2014-10-19: 5 mg via INTRAVENOUS

## 2014-10-19 MED ORDER — FENTANYL CITRATE (PF) 250 MCG/5ML IJ SOLN
INTRAMUSCULAR | Status: DC | PRN
  Administered 2014-10-19 (×3): 50 ug via INTRAVENOUS

## 2014-10-19 MED ORDER — KETOROLAC TROMETHAMINE 30 MG/ML IJ SOLN
INTRAMUSCULAR | Status: AC
  Filled 2014-10-19: qty 1

## 2014-10-19 MED ORDER — FENTANYL CITRATE (PF) 250 MCG/5ML IJ SOLN
INTRAMUSCULAR | Status: AC
  Filled 2014-10-19: qty 5

## 2014-10-19 MED ORDER — ROCURONIUM BROMIDE 50 MG/5ML IV SOLN
INTRAVENOUS | Status: AC
  Filled 2014-10-19: qty 1

## 2014-10-19 MED ORDER — MIDAZOLAM HCL 2 MG/2ML IJ SOLN
INTRAMUSCULAR | Status: AC
  Filled 2014-10-19: qty 2

## 2014-10-19 MED ORDER — ONDANSETRON HCL 4 MG/2ML IJ SOLN
INTRAMUSCULAR | Status: AC
  Filled 2014-10-19: qty 2

## 2014-10-19 MED ORDER — CEFAZOLIN SODIUM-DEXTROSE 2-3 GM-% IV SOLR
2.0000 g | INTRAVENOUS | Status: AC
  Administered 2014-10-19: 2 g via INTRAVENOUS

## 2014-10-19 MED ORDER — LIDOCAINE HCL (CARDIAC) 20 MG/ML IV SOLN
INTRAVENOUS | Status: DC | PRN
  Administered 2014-10-19: 40 mg via INTRAVENOUS

## 2014-10-19 MED ORDER — PROPOFOL 10 MG/ML IV BOLUS
INTRAVENOUS | Status: AC
  Filled 2014-10-19: qty 20

## 2014-10-19 MED ORDER — FENTANYL CITRATE (PF) 100 MCG/2ML IJ SOLN
INTRAMUSCULAR | Status: AC
  Filled 2014-10-19: qty 2

## 2014-10-19 MED ORDER — SUCCINYLCHOLINE CHLORIDE 20 MG/ML IJ SOLN
INTRAMUSCULAR | Status: AC
  Filled 2014-10-19: qty 1

## 2014-10-19 MED ORDER — KETOROLAC TROMETHAMINE 30 MG/ML IJ SOLN
30.0000 mg | Freq: Once | INTRAMUSCULAR | Status: AC
  Administered 2014-10-19: 30 mg via INTRAVENOUS

## 2014-10-19 MED ORDER — SODIUM CHLORIDE 0.9 % IR SOLN
Status: DC | PRN
  Administered 2014-10-19: 3000 mL

## 2014-10-19 MED ORDER — GLYCOPYRROLATE 0.2 MG/ML IJ SOLN
INTRAMUSCULAR | Status: AC
Start: 2014-10-19 — End: 2014-10-19
  Filled 2014-10-19: qty 1

## 2014-10-19 MED ORDER — ONDANSETRON HCL 4 MG/2ML IJ SOLN: 4.0000 mg | Freq: Once | INTRAMUSCULAR | Status: DC | PRN

## 2014-10-19 MED ORDER — SUCCINYLCHOLINE CHLORIDE 20 MG/ML IJ SOLN
INTRAMUSCULAR | Status: DC | PRN
  Administered 2014-10-19: 120 mg via INTRAVENOUS

## 2014-10-19 MED ORDER — ONDANSETRON HCL 4 MG/2ML IJ SOLN
4.0000 mg | Freq: Once | INTRAMUSCULAR | Status: AC
  Administered 2014-10-19: 4 mg via INTRAVENOUS

## 2014-10-19 MED ORDER — FENTANYL CITRATE (PF) 100 MCG/2ML IJ SOLN
25.0000 ug | INTRAMUSCULAR | Status: DC | PRN
  Administered 2014-10-19 (×2): 50 ug via INTRAVENOUS

## 2014-10-19 MED ORDER — MIDAZOLAM HCL 2 MG/2ML IJ SOLN
1.0000 mg | INTRAMUSCULAR | Status: DC | PRN
  Administered 2014-10-19 (×3): 2 mg via INTRAVENOUS
  Filled 2014-10-19 (×2): qty 2

## 2014-10-19 MED ORDER — CEFAZOLIN SODIUM-DEXTROSE 2-3 GM-% IV SOLR
INTRAVENOUS | Status: AC
  Filled 2014-10-19: qty 50

## 2014-10-19 SURGICAL SUPPLY — 28 items
ABLATOR ENDOMETRIAL BIPOLAR (ABLATOR) ×3 IMPLANT
BAG HAMPER (MISCELLANEOUS) ×3 IMPLANT
CLOTH BEACON ORANGE TIMEOUT ST (SAFETY) ×3 IMPLANT
COVER LIGHT HANDLE STERIS (MISCELLANEOUS) ×6 IMPLANT
FORMALIN 10 PREFIL 120ML (MISCELLANEOUS) ×3 IMPLANT
GLOVE BIO SURGEON STRL SZ 6.5 (GLOVE) ×1 IMPLANT
GLOVE BIO SURGEONS STRL SZ 6.5 (GLOVE) ×1
GLOVE BIOGEL PI IND STRL 7.0 (GLOVE) ×2 IMPLANT
GLOVE BIOGEL PI IND STRL 7.5 (GLOVE) IMPLANT
GLOVE BIOGEL PI IND STRL 8 (GLOVE) ×1 IMPLANT
GLOVE BIOGEL PI INDICATOR 7.0 (GLOVE) ×4
GLOVE BIOGEL PI INDICATOR 7.5 (GLOVE) ×2
GLOVE BIOGEL PI INDICATOR 8 (GLOVE) ×2
GLOVE ECLIPSE 8.0 STRL XLNG CF (GLOVE) ×3 IMPLANT
GOWN STRL REUS W/TWL LRG LVL3 (GOWN DISPOSABLE) ×3 IMPLANT
GOWN STRL REUS W/TWL XL LVL3 (GOWN DISPOSABLE) ×3 IMPLANT
INST SET HYSTEROSCOPY (KITS) ×3 IMPLANT
IV NS 1000ML (IV SOLUTION) ×3
IV NS 1000ML BAXH (IV SOLUTION) ×1 IMPLANT
IV NS IRRIG 3000ML ARTHROMATIC (IV SOLUTION) ×3 IMPLANT
KIT ROOM TURNOVER AP CYSTO (KITS) ×3 IMPLANT
MANIFOLD NEPTUNE II (INSTRUMENTS) ×3 IMPLANT
NS IRRIG 1000ML POUR BTL (IV SOLUTION) ×3 IMPLANT
PACK PERI GYN (CUSTOM PROCEDURE TRAY) ×3 IMPLANT
PAD ARMBOARD 7.5X6 YLW CONV (MISCELLANEOUS) ×3 IMPLANT
PAD TELFA 3X4 1S STER (GAUZE/BANDAGES/DRESSINGS) ×3 IMPLANT
SET BASIN LINEN APH (SET/KITS/TRAYS/PACK) ×3 IMPLANT
SET IRRIG Y TYPE TUR BLADDER L (SET/KITS/TRAYS/PACK) ×3 IMPLANT

## 2014-10-19 NOTE — Anesthesia Preprocedure Evaluation (Addendum)
Anesthesia Evaluation  Patient identified by MRN, date of birth, ID band Patient awake    Reviewed: Allergy & Precautions, NPO status , Patient's Chart, lab work & pertinent test results  Airway Mallampati: I  TM Distance: >3 FB     Dental  (+) Teeth Intact, Dental Advisory Given   Pulmonary former smoker,  breath sounds clear to auscultation        Cardiovascular hypertension, Pt. on medications Rhythm:Regular Rate:Normal     Neuro/Psych PSYCHIATRIC DISORDERS Depression    GI/Hepatic GERD-  Medicated,  Endo/Other  Morbid obesity  Renal/GU      Musculoskeletal   Abdominal   Peds  Hematology   Anesthesia Other Findings   Reproductive/Obstetrics                            Anesthesia Physical Anesthesia Plan  ASA: II  Anesthesia Plan: General   Post-op Pain Management:    Induction: Intravenous, Rapid sequence and Cricoid pressure planned  Airway Management Planned: Oral ETT  Additional Equipment:   Intra-op Plan:   Post-operative Plan: Extubation in OR  Informed Consent: I have reviewed the patients History and Physical, chart, labs and discussed the procedure including the risks, benefits and alternatives for the proposed anesthesia with the patient or authorized representative who has indicated his/her understanding and acceptance.     Plan Discussed with:   Anesthesia Plan Comments:         Anesthesia Quick Evaluation

## 2014-10-19 NOTE — Op Note (Signed)
Preoperative diagnosis: Menometrorrhagia                                        Dysmenorrhea                                           Postoperative diagnoses: Same as above   Procedure: Hysteroscopy, , endometrial ablation using Novasure  Surgeon: Florian Buff   Anesthesia: Laryngeal mask airway  Findings: The endometrium was normal. There were no fibroid or other abnormalities. Endometrial biopsy was normal  Description of operation: The patient was taken to the operating room and placed in the supine position. She underwent general anesthesia using the laryngeal mask airway. She was placed in the dorsal lithotomy position and prepped and draped in the usual sterile fashion. A Graves speculum was placed and the anterior cervical lip was grasped with a single-tooth tenaculum. The cervix was dilated serially to allow passage of the hysteroscope. Diagnostic hysteroscopy was performed and was found to be normal.   I then proceeded to perform the Novasure endometrial ablation.  The cervical length was 3.0. The uterus sounded to  8 cm yielding a net length of 5 cm.  The endometrial cavity was 4.5 cm wide. The power was 126 watts.  The total time of therapy was 2 min 0 seconds. The array was evaluated after the procedure and tissue was adherent on all the dimensions of the surface, confirming fundal treatment as well.    All of the equipment worked well throughout the procedure.  The patient was awakened from anesthesia and taken to the recovery room in good stable condition all counts were correct. She received 2 g of Ancef and 30 mg of Toradol preoperatively. She will be discharged from the recovery room and followed up in the office in 1- 2 weeks.  Rakeisha Nyce H 10/19/2014 8:55 AM

## 2014-10-19 NOTE — Anesthesia Procedure Notes (Signed)
Procedure Name: Intubation Date/Time: 10/19/2014 7:48 AM Performed by: Tressie Stalker E Pre-anesthesia Checklist: Patient identified, Patient being monitored, Timeout performed, Emergency Drugs available and Suction available Patient Re-evaluated:Patient Re-evaluated prior to inductionOxygen Delivery Method: Circle System Utilized Preoxygenation: Pre-oxygenation with 100% oxygen Intubation Type: IV induction, Rapid sequence and Cricoid Pressure applied Ventilation: Mask ventilation without difficulty Laryngoscope Size: Mac and 3 Grade View: Grade II Tube type: Oral Tube size: 7.0 mm Number of attempts: 1 Airway Equipment and Method: Stylet Placement Confirmation: ETT inserted through vocal cords under direct vision,  positive ETCO2 and breath sounds checked- equal and bilateral Secured at: 21 cm Tube secured with: Tape Dental Injury: Teeth and Oropharynx as per pre-operative assessment

## 2014-10-19 NOTE — H&P (Signed)
Preoperative History and Physical  Bethany Richardson is a 44 y.o. G2P0 with No LMP recorded (approximate). Patient has had an injection. admitted for a hysteroscopy uterine curettage endometrial ablation for menometrorrhagia and dysmenorrhea.  Has responded to megestrol sonogram no signficant endometrial pathology  PMH:  Past Medical History  Diagnosis Date  . GERD (gastroesophageal reflux disease)   . Adenomyosis   . Obesity   . Depression   . Pneumonia 2013  . Hypertension   . Chronic bronchitis   . Menorrhagia     PSH:  Past Surgical History  Procedure Laterality Date  . Tubal ligation Bilateral 1995    POb/GynH:  OB History    Gravida Para Term Preterm AB TAB SAB Ectopic Multiple Living   2         2      SH:  History  Substance Use Topics  . Smoking status: Former Smoker    Quit date: 01/31/2009  . Smokeless tobacco: Never Used  . Alcohol Use: No    FH:  Family History  Problem Relation Age of Onset  . Hypertension Father   . Hypertension Mother      Allergies: No Known Allergies  Medications:  Current outpatient prescriptions:  . hydrochlorothiazide (HYDRODIURIL) 25 MG tablet, Take 25 mg by mouth daily., Disp: , Rfl:  . megestrol (MEGACE) 40 MG tablet, Take 1 tablet (40 mg total) by mouth daily. (Patient taking differently: Take 80 mg by mouth daily. ), Disp: 30 tablet, Rfl: 11 . ranitidine (ZANTAC) 150 MG capsule, Take 150 mg by mouth daily. , Disp: , Rfl:  . meloxicam (MOBIC) 15 MG tablet, Take 1 tablet (15 mg total) by mouth daily. (Patient not taking: Reported on 10/01/2014), Disp: 30 tablet, Rfl: 0 . metroNIDAZOLE (FLAGYL) 500 MG tablet, Take 1 tablet (500 mg total) by mouth 2 (two) times daily. (Patient not taking: Reported on 05/22/2014), Disp: 14 tablet, Rfl: 0 . metroNIDAZOLE (FLAGYL) 500 MG tablet, Take 1 tablet (500 mg total) by  mouth 2 (two) times daily. (Patient not taking: Reported on 06/25/2014), Disp: 14 tablet, Rfl: 0 . metroNIDAZOLE (FLAGYL) 500 MG tablet, Take 1 tablet (500 mg total) by mouth 2 (two) times daily. (Patient not taking: Reported on 10/01/2014), Disp: 14 tablet, Rfl: 0 . naproxen (NAPROSYN) 500 MG tablet, Take 1 tablet (500 mg total) by mouth 2 (two) times daily with a meal. (Patient not taking: Reported on 05/22/2014), Disp: 60 tablet, Rfl: 0 . nitroGLYCERIN (NITROSTAT) 0.4 MG SL tablet, Place 0.4 mg under the tongue every 5 (five) minutes as needed., Disp: , Rfl:  . Norgestimate-Ethinyl Estradiol Triphasic (TRI-SPRINTEC) 0.18/0.215/0.25 MG-35 MCG tablet, Take 1 tablet by mouth daily. (Patient not taking: Reported on 05/22/2014), Disp: 1 Package, Rfl: 12 . pantoprazole (PROTONIX) 40 MG tablet, Take 1 tablet (40 mg total) by mouth daily. (Patient not taking: Reported on 05/22/2014), Disp: 30 tablet, Rfl: 3 . silver sulfADIAZINE (SILVADENE) 1 % cream, Use 3 times per day (Patient not taking: Reported on 10/01/2014), Disp: 50 g, Rfl: 11  Review of Systems:   Review of Systems  Constitutional: Negative for fever, chills, weight loss, malaise/fatigue and diaphoresis.  HENT: Negative for hearing loss, ear pain, nosebleeds, congestion, sore throat, neck pain, tinnitus and ear discharge.  Eyes: Negative for blurred vision, double vision, photophobia, pain, discharge and redness.  Respiratory: Negative for cough, hemoptysis, sputum production, shortness of breath, wheezing and stridor.  Cardiovascular: Negative for chest pain, palpitations, orthopnea, claudication, leg swelling and PND.  Gastrointestinal:  Positive for abdominal pain. Negative for heartburn, nausea, vomiting, diarrhea, constipation, blood in stool and melena.  Genitourinary: Negative for dysuria, urgency, frequency, hematuria and flank pain.  Musculoskeletal: Negative for myalgias, back pain, joint pain and falls.  Skin: Negative for itching  and rash.  Neurological: Negative for dizziness, tingling, tremors, sensory change, speech change, focal weakness, seizures, loss of consciousness, weakness and headaches.  Endo/Heme/Allergies: Negative for environmental allergies and polydipsia. Does not bruise/bleed easily.  Psychiatric/Behavioral: Negative for depression, suicidal ideas, hallucinations, memory loss and substance abuse. The patient is not nervous/anxious and does not have insomnia.     PHYSICAL EXAM:  Blood pressure 140/80, pulse 80, weight 251 lb (113.853 kg).   Vitals reviewed. Constitutional: She is oriented to person, place, and time. She appears well-developed and well-nourished.  HENT:  Head: Normocephalic and atraumatic.  Right Ear: External ear normal.  Left Ear: External ear normal.  Nose: Nose normal.  Mouth/Throat: Oropharynx is clear and moist.  Eyes: Conjunctivae and EOM are normal. Pupils are equal, round, and reactive to light. Right eye exhibits no discharge. Left eye exhibits no discharge. No scleral icterus.  Neck: Normal range of motion. Neck supple. No tracheal deviation present. No thyromegaly present.  Cardiovascular: Normal rate, regular rhythm, normal heart sounds and intact distal pulses. Exam reveals no gallop and no friction rub.  No murmur heard. Respiratory: Effort normal and breath sounds normal. No respiratory distress. She has no wheezes. She has no rales. She exhibits no tenderness.  GI: Soft. Bowel sounds are normal. She exhibits no distension and no mass. There is tenderness. There is no rebound and no guarding.  Genitourinary:   Vulva is normal without lesions Vagina is pink moist without discharge Cervix normal in appearance and pap is normal Uterus is normal size, contour, position, consistency, mobility, non-tender Adnexa is negative with normal sized ovaries by sonogram  Musculoskeletal: Normal range of motion. She exhibits no edema and no tenderness.   Neurological: She is alert and oriented to person, place, and time. She has normal reflexes. She displays normal reflexes. No cranial nerve deficit. She exhibits normal muscle tone. Coordination normal.  Skin: Skin is warm and dry. No rash noted. No erythema. No pallor.  Psychiatric: She has a normal mood and affect. Her behavior is normal. Judgment and thought content normal.    Labs: No results found for this or any previous visit (from the past 336 hour(s)).  EKG: No orders found for this or any previous visit.  Imaging Studies:  Imaging Results    Dg Hip Unilat With Pelvis 2-3 Views Left  09/12/2014 CLINICAL DATA: 44 year old female with a history of left hip pain. No acute injury. EXAM: LEFT HIP (WITH PELVIS) 2-3 VIEWS COMPARISON: None. FINDINGS: Bony pelvic ring appears intact. No acute bony abnormality. Bilateral hips projects normally over the acetabula. Unremarkable appearance the proximal visualized femurs. Left hip appears congruent. Mild changes of osteoarthritis. IMPRESSION: No radiographic evidence of acute bony abnormality. Mild osteoarthritis bilateral hips. Signed, Dulcy Fanny. Earleen Newport, DO [facility] Radiology Electronically Signed By: Corrie Mckusick D.O. On: 09/12/2014 16:58       Assessment: Menometrorrhagia Dysmenorrhea  Patient Active Problem List   Diagnosis Date Noted  . Sore throat 02/02/2013  . Dysmenorrhea 01/31/2013  . Menorrhagia with irregular cycle 01/31/2013  . Dyspareunia 12/22/2012  . Costochondritis 12/22/2012  . GERD (gastroesophageal reflux disease) 12/22/2012  . Abdominal pain, left lower quadrant 12/19/2012    Plan: Hysteroscopy uterine curettage NovaSure endometrial  ablation  Ahtziry Saathoff H 10/01/2014 2:54 PM

## 2014-10-19 NOTE — Discharge Instructions (Signed)
Endometrial Ablation Endometrial ablation removes the lining of the uterus (endometrium). It is usually a same-day, outpatient treatment. Ablation helps avoid major surgery, such as surgery to remove the cervix and uterus (hysterectomy). After endometrial ablation, you will have little or no menstrual bleeding and may not be able to have children. However, if you are premenopausal, you will need to use a reliable method of birth control following the procedure because of the small chance that pregnancy can occur. There are different reasons to have this procedure, which include:  Heavy periods.  Bleeding that is causing anemia.  Irregular bleeding.  Bleeding fibroids on the lining inside the uterus if they are smaller than 3 centimeters. This procedure should not be done if:  You want children in the future.  You have severe cramps with your menstrual period.  You have precancerous or cancerous cells in your uterus.  You were recently pregnant.  You have gone through menopause.  You have had major surgery on the uterus, such as a cesarean delivery. LET [facility] CARE PROVIDER KNOW ABOUT:  Any allergies you have.  All medicines you are taking, including vitamins, herbs, eye drops, creams, and over-the-counter medicines.  Previous problems you or members of your family have had with the use of anesthetics.  Any blood disorders you have.  Previous surgeries you have had.  Medical conditions you have. RISKS AND COMPLICATIONS  Generally, this is a safe procedure. However, as with any procedure, complications can occur. Possible complications include:  Perforation of the uterus.  Bleeding.  Infection of the uterus, bladder, or vagina.  Injury to surrounding organs.  An air bubble to the lung (air embolus).  Pregnancy following the procedure.  Failure of the procedure to help the problem, requiring hysterectomy.  Decreased ability to diagnose cancer in the lining of  the uterus. BEFORE THE PROCEDURE  The lining of the uterus must be tested to make sure there is no pre-cancerous or cancer cells present.  An ultrasound may be performed to look at the size of the uterus and to check for abnormalities.  Medicines may be given to thin the lining of the uterus. PROCEDURE  During the procedure, your health care provider will use a tool called a resectoscope to help see inside your uterus. There are different ways to remove the lining of your uterus.   Radiofrequency - This method uses a radiofrequency-alternating electric current to remove the lining of the uterus.  Cryotherapy - This method uses extreme cold to freeze the lining of the uterus.  Heated-Free Liquid - This method uses heated salt (saline) solution to remove the lining of the uterus.  Microwave - This method uses high-energy microwaves to heat up the lining of the uterus to remove it.  Thermal balloon - This method involves inserting a catheter with a balloon tip into the uterus. The balloon tip is filled with heated fluid to remove the lining of the uterus. AFTER THE PROCEDURE  After your procedure, do not have sexual intercourse or insert anything into your vagina until permitted by your health care provider. After the procedure, you may experience:  Cramps.  Vaginal discharge.  Frequent urination. Document Released: 03/13/2004 Document Revised: 01/04/2013 Document Reviewed: 10/05/2012 [facility] Patient Information 2015 [place], [place]. This information is not intended to replace advice given to you by your health care provider. Make sure you discuss any questions you have with your health care provider.

## 2014-10-19 NOTE — Transfer of Care (Signed)
Immediate Anesthesia Transfer of Care Note  Patient: Bethany Richardson  Procedure(s) Performed: Procedure(s) with comments: HYSTEROSCOPY, ENDOMETRIAL ABLATION (N/A) - Uterine Cavity Length 5.0cm Uterine Cavity Width 4.5cm Power 124 Time 2 minutes  Patient Location: PACU  Anesthesia Type:General  Level of Consciousness: awake  Airway & Oxygen Therapy: Patient Spontanous Breathing and Patient connected to face mask oxygen  Post-op Assessment: Report given to RN and Post -op Vital signs reviewed and stable  Post vital signs: Reviewed and stable  Last Vitals:  Filed Vitals:   10/19/14 0730  BP: 129/79  Pulse:   Temp:   Resp: 20    Complications: No apparent anesthesia complications

## 2014-10-19 NOTE — Anesthesia Postprocedure Evaluation (Signed)
  Anesthesia Post-op Note  Patient: Bethany Richardson  Procedure(s) Performed: Procedure(s) with comments: HYSTEROSCOPY, ENDOMETRIAL ABLATION (N/A) - Uterine Cavity Length 5.0cm Uterine Cavity Width 4.5cm Power 124 Time 2 minutes  Patient Location: PACU and Short Stay  Anesthesia Type:MAC  Level of Consciousness: awake, alert  and oriented  Airway and Oxygen Therapy: Patient Spontanous Breathing  Post-op Pain: none  Post-op Assessment: Post-op Vital signs reviewed, Patient's Cardiovascular Status Stable, Respiratory Function Stable, Patent Airway and No signs of Nausea or vomiting  Post-op Vital Signs: Reviewed and stable  Last Vitals:  Filed Vitals:   10/19/14 0941  BP: 137/87  Pulse: 87  Temp: 36.3 C  Resp: 16    Complications: No apparent anesthesia complications

## 2014-10-19 NOTE — Interval H&P Note (Signed)
History and Physical Interval Note:  10/19/2014 7:27 AM  Bethany Richardson  has presented today for surgery, with the diagnosis of menorrhagia dysmenorrhea  The various methods of treatment have been discussed with the patient and family. After consideration of risks, benefits and other options for treatment, the patient has consented to  Procedure(s) with comments: [place] (N/A) - 7:30  as a surgical intervention .  The patient's history has been reviewed, patient examined, no change in status, stable for surgery.  I have reviewed the patient's chart and labs.  Questions were answered to the patient's satisfaction.     [name],[name]

## 2014-10-22 ENCOUNTER — Encounter (HOSPITAL_COMMUNITY): Payer: Self-pay | Admitting: Obstetrics & Gynecology

## 2014-10-23 ENCOUNTER — Telehealth: Payer: Self-pay | Admitting: Obstetrics & Gynecology

## 2014-10-23 NOTE — Telephone Encounter (Signed)
Pt had an Endometrial Ablation on 10/19/2014, continues to c/o vaginal bleeding like a regular period. Is this normal and can she take a sit down bath. Per Dr. Elonda Husky can have the vaginal bleeding postoperatively, ok for pt to take a sit down bath. Pt verbalized understanding.

## 2014-10-26 ENCOUNTER — Ambulatory Visit (INDEPENDENT_AMBULATORY_CARE_PROVIDER_SITE_OTHER): Payer: 59 | Admitting: Obstetrics & Gynecology

## 2014-10-26 ENCOUNTER — Encounter: Payer: Self-pay | Admitting: Obstetrics & Gynecology

## 2014-10-26 VITALS — BP 140/80 | HR 76 | Ht 66.0 in | Wt 253.0 lb

## 2014-10-26 DIAGNOSIS — Z9889 Other specified postprocedural states: Secondary | ICD-10-CM

## 2014-10-26 NOTE — Progress Notes (Signed)
Patient ID: Bethany Richardson, female   DOB: 21-Jul-1970, 44 y.o.   MRN: [id number]  HPI: Patient returns for routine postoperative follow-up having undergone hysteroscopy and endometrial ablation on 10/19/2014.  The patient's immediate postoperative recovery has been unremarkable. Since hospital discharge the patient reports bleeding and cramping.   Current Outpatient Prescriptions: acetaminophen (TYLENOL) 325 MG tablet, Take 650 mg by mouth every 6 (six) hours as needed., Disp: , Rfl:  amLODipine (NORVASC) 5 MG tablet, Take 5 mg by mouth daily., Disp: , Rfl:  ranitidine (ZANTAC) 150 MG capsule, Take 150 mg by mouth 2 (two) times daily. , Disp: , Rfl:  HYDROcodone-acetaminophen (NORCO/VICODIN) 5-325 MG per tablet, Take 1 tablet by mouth every 6 (six) hours as needed. (Patient not taking: Reported on 10/05/2014), Disp: 30 tablet, Rfl: 0 ketorolac (TORADOL) 10 MG tablet, Take 1 tablet (10 mg total) by mouth every 8 (eight) hours as needed. (Patient not taking: Reported on 10/05/2014), Disp: 15 tablet, Rfl: 0 meloxicam (MOBIC) 15 MG tablet, Take 1 tablet (15 mg total) by mouth daily. (Patient not taking: Reported on 10/01/2014), Disp: 30 tablet, Rfl: 0 naproxen (NAPROSYN) 500 MG tablet, Take 1 tablet (500 mg total) by mouth 2 (two) times daily with a meal. (Patient not taking: Reported on 05/22/2014), Disp: 60 tablet, Rfl: 0 ondansetron (ZOFRAN) 8 MG tablet, Take 1 tablet (8 mg total) by mouth every 8 (eight) hours as needed for nausea. (Patient not taking: Reported on 10/05/2014), Disp: 12 tablet, Rfl: 0 pantoprazole (PROTONIX) 40 MG tablet, Take 1 tablet (40 mg total) by mouth daily. (Patient not taking: Reported on 05/22/2014), Disp: 30 tablet, Rfl: 3 silver sulfADIAZINE (SILVADENE) 1 % cream, Use 3 times per day (Patient not taking: Reported on 10/01/2014), Disp: 50 g, Rfl: 11  No current facility-administered medications for this visit.    Blood pressure 140/80, pulse 76, height 5' 6"  (1.676 m), weight  253 lb (114.76 kg), last menstrual period 05/18/2014.  Physical Exam: Blood in vault normal post op exam  Diagnostic Tests:   Pathology: benign  Impression: S/p endometrial ablation  Plan:   Follow up: Prn/1  years  Florian Buff, MD

## 2014-11-08 ENCOUNTER — Other Ambulatory Visit: Payer: Self-pay | Admitting: *Deleted

## 2014-11-12 ENCOUNTER — Telehealth: Payer: Self-pay | Admitting: Obstetrics & Gynecology

## 2014-11-12 NOTE — Telephone Encounter (Signed)
Pt states she had the Endometrial ablation on 10/19/2014. Pt states she had sex on 11/03/2014 and now is having bleeding with clots like a regular period. Please advise.

## 2014-11-13 NOTE — Telephone Encounter (Signed)
Sloughing endometrium no concern at this popint

## 2014-11-13 NOTE — Telephone Encounter (Signed)
Pt informed per Dr. Elonda Husky, "sloughing of the Endometrium, no concern at this point." Pt verbalized understanding.

## 2014-11-16 ENCOUNTER — Other Ambulatory Visit: Payer: Self-pay | Admitting: *Deleted

## 2014-11-16 MED ORDER — AMLODIPINE BESYLATE 5 MG PO TABS: 5.0000 mg | ORAL_TABLET | Freq: Every day | ORAL | Status: DC

## 2014-11-16 NOTE — Telephone Encounter (Signed)
Needs refill on Amlodipine.  Rx sent to [facility]. Patient has an appointment next week.

## 2014-11-21 ENCOUNTER — Telehealth: Payer: Self-pay | Admitting: Family Medicine

## 2014-11-21 ENCOUNTER — Ambulatory Visit (INDEPENDENT_AMBULATORY_CARE_PROVIDER_SITE_OTHER): Payer: 59 | Admitting: Family Medicine

## 2014-11-21 ENCOUNTER — Encounter: Payer: Self-pay | Admitting: Family Medicine

## 2014-11-21 VITALS — BP 130/76 | HR 86 | Temp 97.7°F | Ht 66.0 in | Wt 257.6 lb

## 2014-11-21 DIAGNOSIS — I10 Essential (primary) hypertension: Secondary | ICD-10-CM | POA: Diagnosis not present

## 2014-11-21 DIAGNOSIS — R7309 Other abnormal glucose: Secondary | ICD-10-CM | POA: Diagnosis not present

## 2014-11-21 DIAGNOSIS — I878 Other specified disorders of veins: Secondary | ICD-10-CM | POA: Diagnosis not present

## 2014-11-21 DIAGNOSIS — K2100 Gastro-esophageal reflux disease with esophagitis, without bleeding: Secondary | ICD-10-CM

## 2014-11-21 DIAGNOSIS — R079 Chest pain, unspecified: Secondary | ICD-10-CM

## 2014-11-21 DIAGNOSIS — R609 Edema, unspecified: Secondary | ICD-10-CM

## 2014-11-21 DIAGNOSIS — R7303 Prediabetes: Secondary | ICD-10-CM

## 2014-11-21 LAB — POCT CBC
Granulocyte percent: 73.9 %G (ref 37–80)
HEMATOCRIT: 44.6 % (ref 37.7–47.9)
HEMOGLOBIN: 14.6 g/dL (ref 12.2–16.2)
Lymph, poc: 2 (ref 0.6–3.4)
MCH: 25.8 pg — AB (ref 27–31.2)
MCHC: 32.8 g/dL (ref 31.8–35.4)
MCV: 78.8 fL — AB (ref 80–97)
MPV: 9.8 fL (ref 0–99.8)
POC Granulocyte: 6.7 (ref 2–6.9)
POC LYMPH PERCENT: 22.3 %L (ref 10–50)
Platelet Count, POC: 203 10*3/uL (ref 142–424)
RBC: 5.66 M/uL — AB (ref 4.04–5.48)
RDW, POC: 14.8 %
WBC: 9.1 10*3/uL (ref 4.6–10.2)

## 2014-11-21 LAB — POCT GLYCOSYLATED HEMOGLOBIN (HGB A1C): Hemoglobin A1C: 6.3

## 2014-11-21 MED ORDER — TRIAMTERENE-HCTZ 37.5-25 MG PO TABS: 1.0000 | ORAL_TABLET | Freq: Every day | ORAL | Status: DC

## 2014-11-21 MED ORDER — ESOMEPRAZOLE MAGNESIUM 40 MG PO CPDR: 40.0000 mg | DELAYED_RELEASE_CAPSULE | Freq: Every day | ORAL | Status: DC

## 2014-11-21 MED ORDER — DULOXETINE HCL 30 MG PO CPEP: 30.0000 mg | ORAL_CAPSULE | Freq: Every day | ORAL | Status: DC

## 2014-11-21 NOTE — Addendum Note (Signed)
Addended by: Jamelle Haring on: 11/21/2014 08:56 AM   Modules accepted: Miquel Dunn

## 2014-11-21 NOTE — Progress Notes (Addendum)
Subjective:  Patient ID: Bethany Richardson, female    DOB: 11/14/1970  Age: 44 y.o. MRN: [id number]  CC: Hypertension; Edema; and Gastrophageal Reflux   HPI Bethany Richardson presents for  follow-up of hypertension. Patient has no history of headache or shortness of breath or recent cough. Patient also denies symptoms of TIA such as numbness weakness lateralizing. Patient checks  blood pressure at home and has not had any elevated readings recently. Patient denies side effects from his medication. States taking it regularly. Has edema in both legs. It went down overnight. She said there were tiny red bumps present too.  Also in face for 2 days. Frequent chest pain. Negative Cardiolyte in Feb. 2016. That record from [facility] was available through care everywhere and was reviewed with the patient. She actually did not understand that she had had a stress test. All she knows that they put something in her IV that made her feel like hot water was going all over her body and then they ran a machine over her. Also given "Broken heart" dx due to long term mourning over death of her father. Still tearful when discussing after 15 yrs since his death. Put on norvasc at that time. Now having leg cramps.   Patient in for follow-up of GERD. Currently asymptomatic taking  H2B daily. There is daily chest pain but no heartburn. No hematemesis and no melena. No dysphagia or choking. Onset is remote. Progression is stable. Complicating factors, none.  Patient is tearful today. She says there is a lot going on. She is overwhelmed. She is working 2 part-time jobs because she cannot find full-time employment. 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 She just doesn't want to do anything she wants to withdraw. She is tearful and sad frequently. She misses her father and is having trouble coping with her social situation.  Patient has been overweight for many years. She has tried various diets. She has not  been trying recently. She realizes she has to get started on something. She understands she has a great deal at risk for diabetes due to a borderline 6.4 A1c 6 months ago. She is in today for repeat of that A1c as well as dietary advice.    History Bethany Richardson has a past medical history of GERD (gastroesophageal reflux disease); Adenomyosis; Obesity; Depression; Pneumonia (2013); Hypertension; Chronic bronchitis; and Menorrhagia.   She has past surgical history that includes Tubal ligation (Bilateral, 1995) and Hysteroscopy with novasure (N/A, 10/19/2014).   Her family history includes Hypertension in her father and mother.She reports that she quit smoking about 5 years ago. Her smoking use included Cigarettes. She has a 40 pack-year smoking history. She has never used smokeless tobacco. She reports that she drinks alcohol. She reports that she does not use illicit drugs.  Current Outpatient Prescriptions on File Prior to Visit  Medication Sig Dispense Refill  . amLODipine (NORVASC) 5 MG tablet Take 1 tablet (5 mg total) by mouth daily. 30 tablet 0  . silver sulfADIAZINE (SILVADENE) 1 % cream Use 3 times per day 50 g 11   No current facility-administered medications on file prior to visit.    ROS Review of Systems  Constitutional: Negative for fever, chills, diaphoresis, appetite change, fatigue and unexpected weight change.  HENT: Negative for congestion, ear pain, hearing loss, postnasal drip, rhinorrhea, sneezing, sore throat and trouble swallowing.   Eyes: Negative for pain.  Respiratory: Negative for cough, chest tightness and shortness  of breath.   Cardiovascular: Positive for chest pain and leg swelling. Negative for palpitations.  Gastrointestinal: Negative for nausea, vomiting, abdominal pain, diarrhea and constipation.  Genitourinary: Negative for dysuria, frequency and menstrual problem.  Musculoskeletal: Negative for joint swelling and arthralgias.  Skin: Negative for rash.    Neurological: Negative for dizziness, weakness, numbness and headaches.  Psychiatric/Behavioral: Positive for dysphoric mood and decreased concentration. Negative for suicidal ideas, behavioral problems, confusion, sleep disturbance and agitation. The patient is nervous/anxious.     Objective:  BP 130/76 mmHg  Pulse 86  Temp(Src) 97.7 F (36.5 C) (Oral)  Ht _0  (1.676 m)  Wt 257 lb 9.6 oz (116.847 kg)  BMI 41.60 kg/m2  BP Readings from Last 3 Encounters:  11/21/14 130/76  10/26/14 140/80  10/19/14 137/87    Wt Readings from Last 3 Encounters:  11/21/14 257 lb 9.6 oz (116.847 kg)  10/26/14 253 lb (114.76 kg)  10/19/14 252 lb (114.306 kg)     Physical Exam  Constitutional: She is oriented to person, place, and time. She appears well-developed and well-nourished. No distress.  HENT:  Head: Normocephalic and atraumatic.  Right Ear: External ear normal.  Left Ear: External ear normal.  Nose: Nose normal.  Mouth/Throat: Oropharynx is clear and moist.  Eyes: Conjunctivae and EOM are normal. Pupils are equal, round, and reactive to light.  Neck: Normal range of motion. Neck supple. No thyromegaly present.  Cardiovascular: Normal rate, regular rhythm and normal heart sounds.   No murmur heard. Pulmonary/Chest: Effort normal and breath sounds normal. No respiratory distress. She has no wheezes. She has no rales.  Abdominal: Soft. Bowel sounds are normal. She exhibits no distension. There is no tenderness.  Lymphadenopathy:    She has no cervical adenopathy.  Neurological: She is alert and oriented to person, place, and time. She has normal reflexes.  Skin: Skin is warm and dry.  Psychiatric: Her behavior is normal. Judgment and thought content normal.  Tearful when discussing her work situation and life circumstances.    Lab Results  Component Value Date   HGBA1C 6.3 11/21/2014   HGBA1C 6.4* 05/22/2014    Lab Results  Component Value Date   WBC 9.1 11/21/2014   HGB  14.6 11/21/2014   HCT 44.6 11/21/2014   PLT 197 10/17/2014   GLUCOSE 114* 10/17/2014   ALT 20 10/17/2014   AST 15 10/17/2014   NA 139 10/17/2014   K 3.7 10/17/2014   CL 106 10/17/2014   CREATININE 0.86 10/17/2014   BUN 14 10/17/2014   CO2 24 10/17/2014   TSH 3.658 01/31/2013   HGBA1C 6.3 11/21/2014    No results found.  Assessment & Plan:   Rachal was seen today for hypertension, edema and gastrophageal reflux.  Diagnoses and all orders for this visit:  Essential hypertension, benign Orders: -     POCT CBC -     CMP14+EGFR -     Lipid panel -     triamterene-hydrochlorothiazide (MAXZIDE-25) 37.5-25 MG per tablet; Take 1 tablet by mouth daily.  Severe obesity (BMI >= 40) Orders: -     Amb ref to Medical Nutrition Therapy-MNT -     POCT CBC -     CMP14+EGFR -     Lipid panel  Gastroesophageal reflux disease with esophagitis Orders: -     POCT CBC -     CMP14+EGFR -     Lipid panel  Chest pain, unspecified chest pain type Orders: -  POCT CBC -     CMP14+EGFR -     Lipid panel  Prediabetes Orders: -     POCT CBC -     CMP14+EGFR -     POCT glycosylated hemoglobin (Hb A1C) -     Lipid panel  Edema Orders: -     triamterene-hydrochlorothiazide (MAXZIDE-25) 37.5-25 MG per tablet; Take 1 tablet by mouth daily.  Venous stasis of both lower extremities  Other orders -     esomeprazole (NEXIUM) 40 MG capsule; Take 1 capsule (40 mg total) by mouth daily. -     Discontinue: triamterene-hydrochlorothiazide (MAXZIDE-25) 37.5-25 MG per tablet; Take 1 tablet by mouth daily. -     DULoxetine (CYMBALTA) 30 MG capsule; Take 1 capsule (30 mg total) by mouth daily. For one week then two daily. Take with a full stomach at suppertime   I have discontinued Ms. Wiatrek's naproxen, pantoprazole, ranitidine, meloxicam, ondansetron, ketorolac, HYDROcodone-acetaminophen, and acetaminophen. I am also having her start on esomeprazole and DULoxetine. Additionally, I am having  her maintain her silver sulfADIAZINE, amLODipine, and triamterene-hydrochlorothiazide.  Meds ordered this encounter  Medications  . esomeprazole (NEXIUM) 40 MG capsule    Sig: Take 1 capsule (40 mg total) by mouth daily.    Dispense:  30 capsule    Refill:  3  . DISCONTD: triamterene-hydrochlorothiazide (MAXZIDE-25) 37.5-25 MG per tablet    Sig: Take 1 tablet by mouth daily.    Dispense:  90 tablet    Refill:  3  . DULoxetine (CYMBALTA) 30 MG capsule    Sig: Take 1 capsule (30 mg total) by mouth daily. For one week then two daily. Take with a full stomach at suppertime    Dispense:  60 capsule    Refill:  0  . triamterene-hydrochlorothiazide (MAXZIDE-25) 37.5-25 MG per tablet    Sig: Take 1 tablet by mouth daily.    Dispense:  90 tablet    Refill:  3     Follow-up: Return in about 2 weeks (around 12/05/2014) for Depression. Also consider adding metformin at that time. Since she was started on 3 new medications today and considering her dysphoria I believe a fourth medication would be overwhelming for her. In the meantime weight loss for the prediabetes seems highly appropriate for an initial treatment. Hopefully nutritionist can help her start. Meanwhile she was given a handout for both the calorie counting for weight loss and DASH programs. I explained that her swelling is almost certainly due to circulatory compromise caused by her obesity, although the amlodipine does contribute. Elevation of legs and weight loss recommended. Long term, may have to change out the norvasc for something less likely to cause edema.  Claretta Fraise, M.D.

## 2014-11-21 NOTE — Patient Instructions (Addendum)
Calorie Counting for Weight Loss Calories are energy you get from the things you eat and drink. Your body uses this energy to keep you going throughout the day. The number of calories you eat affects your weight. When you eat more calories than your body needs, your body stores the extra calories as fat. When you eat fewer calories than your body needs, your body burns fat to get the energy it needs. Calorie counting means keeping track of how many calories you eat and drink each day. If you make sure to eat fewer calories than your body needs, you should lose weight. In order for calorie counting to work, you will need to eat the number of calories that are right for you in a day to lose a healthy amount of weight per week. A healthy amount of weight to lose per week is usually 1-2 lb (0.5-0.9 kg). A dietitian can determine how many calories you need in a day and give you suggestions on how to reach your calorie goal.  WHAT IS MY MY PLAN? My goal is to have __________ calories per day.  If I have this many calories per day, I should lose around __________ pounds per week. WHAT DO I NEED TO KNOW ABOUT CALORIE COUNTING? In order to meet your daily calorie goal, you will need to:  Find out how many calories are in each food you would like to eat. Try to do this before you eat.  Decide how much of the food you can eat.  Write down what you ate and how many calories it had. Doing this is called keeping a food log. WHERE DO I FIND CALORIE INFORMATION? The number of calories in a food can be found on a Nutrition Facts label. Note that all the information on a label is based on a specific serving of the food. If a food does not have a Nutrition Facts label, try to look up the calories online or ask your dietitian for help. HOW DO I DECIDE HOW MUCH TO EAT? To decide how much of the food you can eat, you will need to consider both the number of calories in one serving and the size of one serving. This  information can be found on the Nutrition Facts label. If a food does not have a Nutrition Facts label, look up the information online or ask your dietitian for help. Remember that calories are listed per serving. If you choose to have more than one serving of a food, you will have to multiply the calories per serving by the amount of servings you plan to eat. For example, the label on a package of bread might say that a serving size is 1 slice and that there are 90 calories in a serving. If you eat 1 slice, you will have eaten 90 calories. If you eat 2 slices, you will have eaten 180 calories. HOW DO I KEEP A FOOD LOG? After each meal, record the following information in your food log:  What you ate.  How much of it you ate.  How many calories it had.  Then, add up your calories. Keep your food log near you, such as in a small notebook in your pocket. Another option is to use a mobile app or website. Some programs will calculate calories for you and show you how many calories you have left each time you add an item to the log. WHAT ARE SOME CALORIE COUNTING TIPS?  Use your calories on foods   and drinks that will fill you up and not leave you hungry. Some examples of this include foods like nuts and nut butters, vegetables, lean proteins, and high-fiber foods (more than 5 g fiber per serving).  Eat nutritious foods and avoid empty calories. Empty calories are calories you get from foods or beverages that do not have many nutrients, such as candy and soda. It is better to have a nutritious high-calorie food (such as an avocado) than a food with few nutrients (such as a bag of chips).  Know how many calories are in the foods you eat most often. This way, you do not have to look up how many calories they have each time you eat them.  Look out for foods that may seem like low-calorie foods but are really high-calorie foods, such as baked goods, soda, and fat-free candy.  Pay attention to calories  in drinks. Drinks such as sodas, specialty coffee drinks, alcohol, and juices have a lot of calories yet do not fill you up. Choose low-calorie drinks like water and diet drinks.  Focus your calorie counting efforts on higher calorie items. Logging the calories in a garden salad that contains only vegetables is less important than calculating the calories in a milk shake.  Find a way of tracking calories that works for you. Get creative. Most people who are successful find ways to keep track of how much they eat in a day, even if they do not count every calorie. WHAT ARE SOME PORTION CONTROL TIPS?  Know how many calories are in a serving. This will help you know how many servings of a certain food you can have.  Use a measuring cup to measure serving sizes. This is helpful when you start out. With time, you will be able to estimate serving sizes for some foods.  Take some time to put servings of different foods on your favorite plates, bowls, and cups so you know what a serving looks like.  Try not to eat straight from a bag or box. Doing this can lead to overeating. Put the amount you would like to eat in a cup or on a plate to make sure you are eating the right portion.  Use smaller plates, glasses, and bowls to prevent overeating. This is a quick and easy way to practice portion control. If your plate is smaller, less food can fit on it.  Try not to multitask while eating, such as watching TV or using your computer. If it is time to eat, sit down at a table and enjoy your food. Doing this will help you to start recognizing when you are full. It will also make you more aware of what and how much you are eating. HOW CAN I CALORIE COUNT WHEN EATING OUT?  Ask for smaller portion sizes or child-sized portions.  Consider sharing an entree and sides instead of getting your own entree.  If you get your own entree, eat only half. Ask for a box at the beginning of your meal and put the rest of your  entree in it so you are not tempted to eat it.  Look for the calories on the menu. If calories are listed, choose the lower calorie options.  Choose dishes that include vegetables, fruits, whole grains, low-fat dairy products, and lean protein. Focusing on smart food choices from each of the 5 food groups can help you stay on track at restaurants.  Choose items that are boiled, broiled, grilled, or steamed.  Choose   water, milk, unsweetened iced tea, or other drinks without added sugars. If you want an alcoholic beverage, choose a lower calorie option. For example, a regular margarita can have up to 700 calories and a glass of wine has around 150.  Stay away from items that are buttered, battered, fried, or served with cream sauce. Items labeled "crispy" are usually fried, unless stated otherwise.  Ask for dressings, sauces, and syrups on the side. These are usually very high in calories, so do not eat much of them.  Watch out for salads. Many people think salads are a healthy option, but this is often not the case. Many salads come with bacon, fried chicken, lots of cheese, fried chips, and dressing. All of these items have a lot of calories. If you want a salad, choose a garden salad and ask for grilled meats or steak. Ask for the dressing on the side, or ask for olive oil and vinegar or lemon to use as dressing.  Estimate how many servings of a food you are given. For example, a serving of cooked rice is  cup or about the size of half a tennis ball or one cupcake wrapper. Knowing serving sizes will help you be aware of how much food you are eating at restaurants. The list below tells you how big or small some common portion sizes are based on everyday objects.  1 oz--4 stacked dice.  3 oz--1 deck of cards.  1 tsp--1 dice.  1 Tbsp-- a Ping-Pong ball.  2 Tbsp--1 Ping-Pong ball.   cup--1 tennis ball or 1 cupcake wrapper.  1 cup--1 baseball. Document Released: 05/04/2005 Document  Revised: 09/18/2013 Document Reviewed: 03/09/2013 [facility] Patient Information 2015 [place], [place]. This information is not intended to replace advice given to you by your health care provider. Make sure you discuss any questions you have with your health care provider. DASH Eating Plan DASH stands for "Dietary Approaches to Stop Hypertension." The DASH eating plan is a healthy eating plan that has been shown to reduce high blood pressure (hypertension). Additional health benefits may include reducing the risk of type 2 diabetes mellitus, heart disease, and stroke. The DASH eating plan may also help with weight loss. WHAT DO I NEED TO KNOW ABOUT THE DASH EATING PLAN? For the DASH eating plan, you will follow these general guidelines:  Choose foods with a percent daily value for sodium of less than 5% (as listed on the food label).  Use salt-free seasonings or herbs instead of table salt or sea salt.  Check with your health care provider or pharmacist before using salt substitutes.  Eat lower-sodium products, often labeled as "lower sodium" or "no salt added."  Eat fresh foods.  Eat more vegetables, fruits, and low-fat dairy products.  Choose whole grains. Look for the word "whole" as the first word in the ingredient list.  Choose fish and skinless chicken or [place] more often than red meat. Limit fish, poultry, and meat to 6 oz (170 g) each day.  Limit sweets, desserts, sugars, and sugary drinks.  Choose heart-healthy fats.  Limit cheese to 1 oz (28 g) per day.  Eat more home-cooked food and less restaurant, buffet, and fast food.  Limit fried foods.  Cook foods using methods other than frying.  Limit canned vegetables. If you do use them, rinse them well to decrease the sodium.  When eating at a restaurant, ask that your food be prepared with less salt, or no salt if possible. WHAT FOODS CAN I  EAT? Seek help from a dietitian for individual calorie needs. Grains Whole grain  or whole wheat bread. Brown rice. Whole grain or whole wheat pasta. Quinoa, bulgur, and whole grain cereals. Low-sodium cereals. Corn or whole wheat flour tortillas. Whole grain cornbread. Whole grain crackers. Low-sodium crackers. Vegetables Fresh or frozen vegetables (raw, steamed, roasted, or grilled). Low-sodium or reduced-sodium tomato and vegetable juices. Low-sodium or reduced-sodium tomato sauce and paste. Low-sodium or reduced-sodium canned vegetables.  Fruits All fresh, canned (in natural juice), or frozen fruits. Meat and Other Protein Products Ground beef (85% or leaner), grass-fed beef, or beef trimmed of fat. Skinless chicken or [place]. Ground chicken or [place]. Pork trimmed of fat. All fish and seafood. Eggs. Dried beans, peas, or lentils. Unsalted nuts and seeds. Unsalted canned beans. Dairy Low-fat dairy products, such as skim or 1% milk, 2% or reduced-fat cheeses, low-fat ricotta or cottage cheese, or plain low-fat yogurt. Low-sodium or reduced-sodium cheeses. Fats and Oils Tub margarines without trans fats. Light or reduced-fat mayonnaise and salad dressings (reduced sodium). Avocado. Safflower, olive, or canola oils. Natural peanut or almond butter. Other Unsalted popcorn and pretzels. The items listed above may not be a complete list of recommended foods or beverages. Contact your dietitian for more options. WHAT FOODS ARE NOT RECOMMENDED? Grains White bread. White pasta. White rice. Refined cornbread. Bagels and croissants. Crackers that contain trans fat. Vegetables Creamed or fried vegetables. Vegetables in a cheese sauce. Regular canned vegetables. Regular canned tomato sauce and paste. Regular tomato and vegetable juices. Fruits Dried fruits. Canned fruit in light or heavy syrup. Fruit juice. Meat and Other Protein Products Fatty cuts of meat. Ribs, chicken wings, bacon, sausage, bologna, salami, chitterlings, fatback, hot dogs, bratwurst, and packaged luncheon meats.  Salted nuts and seeds. Canned beans with salt. Dairy Whole or 2% milk, cream, half-and-half, and cream cheese. Whole-fat or sweetened yogurt. Full-fat cheeses or blue cheese. Nondairy creamers and whipped toppings. Processed cheese, cheese spreads, or cheese curds. Condiments Onion and garlic salt, seasoned salt, table salt, and sea salt. Canned and packaged gravies. Worcestershire sauce. Tartar sauce. Barbecue sauce. Teriyaki sauce. Soy sauce, including reduced sodium. Steak sauce. Fish sauce. Oyster sauce. Cocktail sauce. Horseradish. Ketchup and mustard. Meat flavorings and tenderizers. Bouillon cubes. Hot sauce. Tabasco sauce. Marinades. Taco seasonings. Relishes. Fats and Oils Butter, stick margarine, lard, shortening, ghee, and bacon fat. Coconut, palm kernel, or palm oils. Regular salad dressings. Other Pickles and olives. Salted popcorn and pretzels. The items listed above may not be a complete list of foods and beverages to avoid. Contact your dietitian for more information. WHERE CAN I FIND MORE INFORMATION? National Heart, [employer], and [employer] Institute: travelstabloid.com Document Released: 04/23/2011 Document Revised: 09/18/2013 Document Reviewed: 03/08/2013 [facility] Patient Information 2015 [place], [place]. This information is not intended to replace advice given to you by your health care provider. Make sure you discuss any questions you have with your health care provider.

## 2014-11-21 NOTE — Addendum Note (Signed)
Addended by: Claretta Fraise on: 11/21/2014 10:05 AM   Modules accepted: Orders, Level of Service, SmartSet

## 2014-11-22 ENCOUNTER — Telehealth: Payer: Self-pay | Admitting: *Deleted

## 2014-11-22 LAB — CMP14+EGFR
ALBUMIN: 3.9 g/dL (ref 3.5–5.5)
ALT: 42 IU/L — ABNORMAL HIGH (ref 0–32)
AST: 29 IU/L (ref 0–40)
Albumin/Globulin Ratio: 1.4 (ref 1.1–2.5)
Alkaline Phosphatase: 101 IU/L (ref 39–117)
BILIRUBIN TOTAL: 0.6 mg/dL (ref 0.0–1.2)
BUN/Creatinine Ratio: 16 (ref 9–23)
BUN: 13 mg/dL (ref 6–24)
CALCIUM: 9.3 mg/dL (ref 8.7–10.2)
CHLORIDE: 98 mmol/L (ref 97–108)
CO2: 25 mmol/L (ref 18–29)
Creatinine, Ser: 0.8 mg/dL (ref 0.57–1.00)
GFR calc Af Amer: 105 mL/min/{1.73_m2} (ref >59)
GFR calc non Af Amer: 91 mL/min/{1.73_m2} (ref >59)
GLOBULIN, TOTAL: 2.7 g/dL (ref 1.5–4.5)
GLUCOSE: 131 mg/dL — AB (ref 65–99)
Potassium: 4 mmol/L (ref 3.5–5.2)
Sodium: 139 mmol/L (ref 134–144)
Total Protein: 6.6 g/dL (ref 6.0–8.5)

## 2014-11-22 LAB — LIPID PANEL
CHOL/HDL RATIO: 3.4 ratio (ref 0.0–4.4)
Cholesterol, Total: 200 mg/dL — ABNORMAL HIGH (ref 100–199)
HDL: 58 mg/dL (ref >39)
LDL Calculated: 115 mg/dL — ABNORMAL HIGH (ref 0–99)
Triglycerides: 134 mg/dL (ref 0–149)
VLDL Cholesterol Cal: 27 mg/dL (ref 5–40)

## 2014-11-22 NOTE — Telephone Encounter (Signed)
Patient called and stated that she did not want to try the cymbalta at this time. She just wants to start with the new bp med and the new indigestion medication.

## 2014-11-22 NOTE — Telephone Encounter (Signed)
Prior authoriztion in process Pt notified

## 2014-11-23 ENCOUNTER — Telehealth: Payer: Self-pay

## 2014-11-23 NOTE — Telephone Encounter (Signed)
Need to do a prior authorization for Cymbalta.  What is the DX that she uses this for?

## 2014-11-23 NOTE — Telephone Encounter (Signed)
Depression, anxiety & chronic pain

## 2014-11-24 LAB — IRON AND TIBC
IRON SATURATION: 16 % (ref 15–55)
IRON: 47 ug/dL (ref 27–159)
Total Iron Binding Capacity: 303 ug/dL (ref 250–450)
UIBC: 256 ug/dL (ref 131–425)

## 2014-11-24 LAB — SPECIMEN STATUS REPORT

## 2014-11-24 LAB — FERRITIN: Ferritin: 61 ng/mL (ref 15–150)

## 2014-11-27 ENCOUNTER — Telehealth: Payer: Self-pay | Admitting: Family Medicine

## 2014-11-27 NOTE — Telephone Encounter (Signed)
Pt aware of lab results 

## 2014-12-04 ENCOUNTER — Telehealth: Payer: Self-pay

## 2014-12-04 NOTE — Telephone Encounter (Signed)
Insurance prior authorized Duloxetine HCL through 11/23/15

## 2014-12-10 ENCOUNTER — Ambulatory Visit: Payer: 59 | Admitting: Family Medicine

## 2014-12-17 ENCOUNTER — Encounter: Payer: Self-pay | Admitting: Family Medicine

## 2015-01-04 ENCOUNTER — Telehealth: Payer: Self-pay | Admitting: Obstetrics & Gynecology

## 2015-01-04 NOTE — Telephone Encounter (Signed)
Pt states had Endometrial Ablation on 10/19/2014, pt states since she had the Ablation every time she has intercourse she has vaginal bleeding and this past week she states the vaginal bleeding lasted 8 days after intercourse. Pt states she is concerned. Pt given an appt for August 22 at 4:15 with Dr. Elonda Husky.

## 2015-01-07 ENCOUNTER — Encounter: Payer: Self-pay | Admitting: Obstetrics & Gynecology

## 2015-01-07 ENCOUNTER — Ambulatory Visit (INDEPENDENT_AMBULATORY_CARE_PROVIDER_SITE_OTHER): Payer: 59 | Admitting: Obstetrics & Gynecology

## 2015-01-07 VITALS — BP 110/70 | HR 80 | Ht 67.0 in | Wt 254.0 lb

## 2015-01-07 DIAGNOSIS — N930 Postcoital and contact bleeding: Secondary | ICD-10-CM

## 2015-01-07 MED ORDER — PROGESTERONE MICRONIZED 200 MG PO CAPS: 200.0000 mg | ORAL_CAPSULE | Freq: Every day | ORAL | Status: DC

## 2015-01-07 MED ORDER — DOXYCYCLINE HYCLATE 50 MG PO CAPS: 100.0000 mg | ORAL_CAPSULE | Freq: Two times a day (BID) | ORAL | Status: DC

## 2015-01-07 NOTE — Progress Notes (Signed)
Patient ID: Bethany Richardson, female   DOB: 1971/05/14, 44 y.o.   MRN: [id number] Chief Complaint  Patient presents with  . gyn visit    bleeding after intercourse.had ablation 10/26/14    Blood pressure 110/70, pulse 80, height 5' 7"  (1.702 m), weight 254 lb (115.214 kg).  Chief Complaint  Patient presents with  . gyn visit    bleeding after intercourse.had ablation 10/26/14    Blood pressure 110/70, pulse 80, height 5' 7"  (1.702 m), weight 254 lb (115.214 kg).  44 y.o. G2P0 No LMP recorded. Patient has had an ablation. The current method of family planning is tubal ligation.  Subjective Bleeding after intercours No pain   Objective Vulva:  normal appearing vulva with no masses, tenderness or lesions Vagina:  normal mucosa, no discharge Cervix:  anteverted, no cervical motion tenderness and no lesions Uterus:  normal size, contour, position, consistency, mobility, non-tender Adnexa: ovaries:present,  normal adnexa in size, nontender and no masses    Pertinent ROS No burning with urination, frequency or urgency No nausea, vomiting or diarrhea Nor fever chills or other constitutional symptoms   Labs or studies   Impression Diagnoses this Encounter:: Post coital bleeding Established relevant diagnosis(es):   Plan/Recommendations Meds ordered this encounter  Medications  . progesterone (PROMETRIUM) 200 MG capsule    Sig: Take 1 capsule (200 mg total) by mouth daily.    Dispense:  30 capsule    Refill:  2  . doxycycline (VIBRAMYCIN) 50 MG capsule    Sig: Take 2 capsules (100 mg total) by mouth 2 (two) times daily.    Dispense:  40 capsule    Refill:  0     Follow up 436month    All questions were answered.

## 2015-02-07 ENCOUNTER — Ambulatory Visit: Payer: 59 | Admitting: Obstetrics & Gynecology

## 2015-06-14 ENCOUNTER — Other Ambulatory Visit: Payer: Self-pay | Admitting: Obstetrics & Gynecology

## 2015-06-26 ENCOUNTER — Other Ambulatory Visit: Payer: Self-pay | Admitting: Physician Assistant

## 2015-08-17 ENCOUNTER — Other Ambulatory Visit: Payer: Self-pay | Admitting: Obstetrics & Gynecology

## 2015-08-26 ENCOUNTER — Ambulatory Visit (INDEPENDENT_AMBULATORY_CARE_PROVIDER_SITE_OTHER): Payer: BLUE CROSS/BLUE SHIELD | Admitting: Family Medicine

## 2015-08-26 ENCOUNTER — Encounter: Payer: Self-pay | Admitting: Family Medicine

## 2015-08-26 ENCOUNTER — Encounter (INDEPENDENT_AMBULATORY_CARE_PROVIDER_SITE_OTHER): Payer: Self-pay

## 2015-08-26 ENCOUNTER — Encounter: Payer: Self-pay | Admitting: *Deleted

## 2015-08-26 VITALS — BP 129/87 | HR 82 | Temp 99.1°F | Ht 67.0 in | Wt 260.0 lb

## 2015-08-26 DIAGNOSIS — L918 Other hypertrophic disorders of the skin: Secondary | ICD-10-CM | POA: Diagnosis not present

## 2015-08-26 NOTE — Progress Notes (Signed)
BP 129/87 mmHg  Pulse 82  Temp(Src) 99.1 F (37.3 C) (Oral)  Ht 5' 7"  (1.702 m)  Wt 260 lb (117.935 kg)  BMI 40.71 kg/m2   Subjective:    Patient ID: Bethany Richardson, female    DOB: Jan 28, 1971, 45 y.o.   MRN: [id number]  HPI: Bethany Richardson is a 45 y.o. female presenting on 08/26/2015 for Skin tag removal   HPI Skin tags Patient has 4 skin tags that are bothering her and aren't bad locations. 3 of them are along her right neckline and they catch along the best that she has to wear a lot for work. The other is under her right axilla and has become very large. She gets skin tags elsewhere but these are the ones that are bothering her and bleeds sometimes when they're rubbed against her close. She denies any fevers or chills or redness or warmth.  Relevant past medical, surgical, family and social history reviewed and updated as indicated. Interim medical history since our last visit reviewed. Allergies and medications reviewed and updated.  Review of Systems  Constitutional: Negative for fever and chills.  HENT: Negative for congestion, ear discharge and ear pain.   Eyes: Negative for redness and visual disturbance.  Respiratory: Negative for chest tightness and shortness of breath.   Cardiovascular: Negative for chest pain and leg swelling.  Genitourinary: Negative for dysuria and difficulty urinating.  Musculoskeletal: Negative for back pain and gait problem.  Skin: Negative for color change and rash.  Neurological: Negative for light-headedness and headaches.  Psychiatric/Behavioral: Negative for behavioral problems and agitation.  All other systems reviewed and are negative.   Per HPI unless specifically indicated above     Medication List       This list is accurate as of: 08/26/15  2:36 PM.  Always use your most recent med list.               esomeprazole 40 MG capsule  Commonly known as:  NEXIUM  Take 1 capsule (40 mg total) by mouth daily.     triamterene-hydrochlorothiazide 37.5-25 MG tablet  Commonly known as:  MAXZIDE-25  Take 1 tablet by mouth daily.           Objective:    BP 129/87 mmHg  Pulse 82  Temp(Src) 99.1 F (37.3 C) (Oral)  Ht 5' 7"  (1.702 m)  Wt 260 lb (117.935 kg)  BMI 40.71 kg/m2  Wt Readings from Last 3 Encounters:  08/26/15 260 lb (117.935 kg)  01/07/15 254 lb (115.214 kg)  11/21/14 257 lb 9.6 oz (116.847 kg)    Physical Exam  Constitutional: She is oriented to person, place, and time. She appears well-developed and well-nourished. No distress.  Eyes: Conjunctivae and EOM are normal. Pupils are equal, round, and reactive to light.  Neck: Neck supple. No thyromegaly present.  Cardiovascular: Normal rate, regular rhythm, normal heart sounds and intact distal pulses.   No murmur heard. Pulmonary/Chest: Effort normal and breath sounds normal. No respiratory distress. She has no wheezes.  Musculoskeletal: Normal range of motion. She exhibits no edema or tenderness.  Lymphadenopathy:    She has no cervical adenopathy.  Neurological: She is alert and oriented to person, place, and time. Coordination normal.  Skin: Skin is warm and dry. Lesion (3 small skin tags in the fold along her right neckline. One larger skin tag under her right axilla.) noted. No rash noted. She is not diaphoretic.  Psychiatric: She has a normal mood and  affect. Her behavior is normal.  Nursing note and vitals reviewed.  Skin tag removal: Forceps and scissors were used to exercise each skin tag. Silver nitrate used for hemostasis. Patient tolerated well. Minimal bleeding. Covered with simple bandage.    Assessment & Plan:   Problem List Items Addressed This Visit    None    Visit Diagnoses    Skin tag    -  Primary    Irritated and sometimes bleeding skin tags, 3 on right neckline and 1 on right axilla.       Follow up plan: Return if symptoms worsen or fail to improve.  Counseling provided for all of the vaccine  components No orders of the defined types were placed in this encounter.    Caryl Pina, MD [facility] 08/26/2015, 2:36 PM

## 2015-10-03 ENCOUNTER — Telehealth: Payer: Self-pay | Admitting: Family Medicine

## 2015-10-10 ENCOUNTER — Ambulatory Visit: Payer: BLUE CROSS/BLUE SHIELD | Admitting: Family Medicine

## 2015-10-16 ENCOUNTER — Other Ambulatory Visit: Payer: Self-pay | Admitting: Family Medicine

## 2015-10-21 ENCOUNTER — Telehealth: Payer: Self-pay | Admitting: Family Medicine

## 2015-10-21 DIAGNOSIS — I10 Essential (primary) hypertension: Secondary | ICD-10-CM

## 2015-10-21 MED ORDER — TRIAMTERENE-HCTZ 37.5-25 MG PO TABS: 1.0000 | ORAL_TABLET | Freq: Every day | ORAL | Status: DC

## 2015-10-21 NOTE — Telephone Encounter (Signed)
done

## 2016-01-23 MED ORDER — AMLODIPINE BESYLATE 5 MG PO TABS: ORAL_TABLET | ORAL | 11 refills | Status: DC

## 2016-02-16 ENCOUNTER — Other Ambulatory Visit: Payer: Self-pay | Admitting: Family Medicine

## 2016-02-16 DIAGNOSIS — I10 Essential (primary) hypertension: Secondary | ICD-10-CM

## 2016-02-21 ENCOUNTER — Other Ambulatory Visit: Payer: Self-pay | Admitting: Family Medicine

## 2016-02-21 DIAGNOSIS — I10 Essential (primary) hypertension: Secondary | ICD-10-CM

## 2016-05-20 LAB — HM DIABETES EYE EXAM

## 2016-06-26 ENCOUNTER — Ambulatory Visit (INDEPENDENT_AMBULATORY_CARE_PROVIDER_SITE_OTHER): Payer: BLUE CROSS/BLUE SHIELD | Admitting: Family Medicine

## 2016-06-26 ENCOUNTER — Other Ambulatory Visit: Payer: BLUE CROSS/BLUE SHIELD

## 2016-06-26 ENCOUNTER — Encounter: Payer: Self-pay | Admitting: Family Medicine

## 2016-06-26 VITALS — BP 169/88 | HR 80 | Temp 97.1°F | Ht 67.0 in | Wt 264.6 lb

## 2016-06-26 DIAGNOSIS — I10 Essential (primary) hypertension: Secondary | ICD-10-CM | POA: Diagnosis not present

## 2016-06-26 DIAGNOSIS — R109 Unspecified abdominal pain: Secondary | ICD-10-CM | POA: Diagnosis not present

## 2016-06-26 DIAGNOSIS — Z23 Encounter for immunization: Secondary | ICD-10-CM | POA: Diagnosis not present

## 2016-06-26 MED ORDER — DICYCLOMINE HCL 20 MG PO TABS: 20.0000 mg | ORAL_TABLET | Freq: Four times a day (QID) | ORAL | 1 refills | Status: DC

## 2016-06-26 MED ORDER — TRIAMTERENE-HCTZ 37.5-25 MG PO TABS: 1.0000 | ORAL_TABLET | Freq: Every day | ORAL | 0 refills | Status: DC

## 2016-06-26 NOTE — Progress Notes (Signed)
BP (!) 169/88   Pulse 80   Temp 97.1 F (36.2 C) (Oral)   Ht 5' 7"  (1.702 m)   Wt 264 lb 9.6 oz (120 kg)   LMP 06/25/2016   BMI 41.44 kg/m    Subjective:    Patient ID: Bethany Richardson, female    DOB: December 16, 1970, 46 y.o.   MRN: [id number]  HPI: Bethany Richardson is a 46 y.o. female presenting on 06/26/2016 for Medication Refill (BP when she takes she feels drained & tired) and Leg Pain (right side that radiates)   HPI Left sided pain Patient has been having left-sided abdominal pain and fatigue. This is been going on intermittently for at least a year. She does admit that sometimes she gets constipation but then sometimes she has normal stools. She denies any blood ever happening in her stool. She denies any fevers or chills. The abdominal pain is on the left lateral side and sometimes is bad enough that she cannot lay on that side. She denies any urinary problems. She does admit that she has some problems with menorrhagia that has been going on. She is seen an obstetrician for this. She says that the abdominal pain does not necessarily correlate to when she has her menorrhagia. She did have an episode of nausea and wants last week and was associated with abdominal pain but usually does not get nauseous.  Relevant past medical, surgical, family and social history reviewed and updated as indicated. Interim medical history since our last visit reviewed. Allergies and medications reviewed and updated.  Review of Systems  Constitutional: Positive for fatigue. Negative for chills and fever.  Respiratory: Negative for chest tightness and shortness of breath.   Cardiovascular: Negative for chest pain and leg swelling.  Gastrointestinal: Positive for abdominal pain, constipation and nausea. Negative for anal bleeding, blood in stool, diarrhea and vomiting.  Genitourinary: Negative for difficulty urinating and dysuria.  Musculoskeletal: Negative for back pain and gait problem.  Skin: Negative  for rash.  Neurological: Negative for light-headedness and headaches.  Psychiatric/Behavioral: Negative for agitation and behavioral problems.  All other systems reviewed and are negative.   Per HPI unless specifically indicated above     Objective:    BP (!) 169/88   Pulse 80   Temp 97.1 F (36.2 C) (Oral)   Ht 5' 7"  (1.702 m)   Wt 264 lb 9.6 oz (120 kg)   LMP 06/25/2016   BMI 41.44 kg/m   Wt Readings from Last 3 Encounters:  06/26/16 264 lb 9.6 oz (120 kg)  08/26/15 260 lb (117.9 kg)  01/07/15 254 lb (115.2 kg)    Physical Exam  Constitutional: She is oriented to person, place, and time. She appears well-developed and well-nourished. No distress.  Eyes: Conjunctivae are normal.  Cardiovascular: Normal rate, regular rhythm, normal heart sounds and intact distal pulses.   No murmur heard. Pulmonary/Chest: Effort normal and breath sounds normal. No respiratory distress. She has no wheezes. She has no rales.  Abdominal: Soft. Bowel sounds are normal. She exhibits no distension. There is no hepatosplenomegaly. There is tenderness in the left upper quadrant and left lower quadrant. There is no rigidity, no rebound, no guarding, no CVA tenderness, no tenderness at McBurney's point and negative Murphy's sign.  Musculoskeletal: Normal range of motion. She exhibits no edema or tenderness.  Neurological: She is alert and oriented to person, place, and time. Coordination normal.  Skin: Skin is warm and dry. No rash noted. She is not  diaphoretic.  Psychiatric: She has a normal mood and affect. Her behavior is normal.  Nursing note and vitals reviewed.     Assessment & Plan:   Problem List Items Addressed This Visit    None    Visit Diagnoses    Left lateral abdominal pain    -  Primary   Chronic intermittent left abdominal pain with some intermittent constipation   Relevant Orders   DG Abd 1 View   Essential hypertension, benign       Relevant Medications    triamterene-hydrochlorothiazide (MAXZIDE-25) 37.5-25 MG tablet      Unable to do x-ray today because patient has to leave, we'll have her come back and do it when she returns for fasting labs on Monday.  Follow up plan: Return in about 1 week (around 07/03/2016), or if symptoms worsen or fail to improve, for Hypertension and fasting labs.  Counseling provided for all of the vaccine components Orders Placed This Encounter  Procedures  . [place], MD [facility] 06/26/2016, 1:31 PM

## 2016-06-29 ENCOUNTER — Ambulatory Visit (INDEPENDENT_AMBULATORY_CARE_PROVIDER_SITE_OTHER): Payer: BLUE CROSS/BLUE SHIELD | Admitting: Family Medicine

## 2016-06-29 ENCOUNTER — Other Ambulatory Visit: Payer: BLUE CROSS/BLUE SHIELD

## 2016-06-29 ENCOUNTER — Encounter: Payer: Self-pay | Admitting: Family Medicine

## 2016-06-29 ENCOUNTER — Other Ambulatory Visit: Payer: Self-pay | Admitting: Family Medicine

## 2016-06-29 ENCOUNTER — Ambulatory Visit (INDEPENDENT_AMBULATORY_CARE_PROVIDER_SITE_OTHER): Payer: BLUE CROSS/BLUE SHIELD

## 2016-06-29 VITALS — BP 125/76 | HR 73 | Temp 96.6°F | Ht 67.0 in | Wt 260.0 lb

## 2016-06-29 DIAGNOSIS — I10 Essential (primary) hypertension: Secondary | ICD-10-CM

## 2016-06-29 DIAGNOSIS — R109 Unspecified abdominal pain: Secondary | ICD-10-CM | POA: Diagnosis not present

## 2016-06-29 DIAGNOSIS — Z23 Encounter for immunization: Secondary | ICD-10-CM

## 2016-06-29 DIAGNOSIS — R7303 Prediabetes: Secondary | ICD-10-CM

## 2016-06-29 DIAGNOSIS — Z0000 Encounter for general adult medical examination without abnormal findings: Secondary | ICD-10-CM | POA: Diagnosis not present

## 2016-06-29 LAB — BAYER DCA HB A1C WAIVED: HB A1C (BAYER DCA - WAIVED): 7.3 % — ABNORMAL HIGH (ref <7.0)

## 2016-06-29 MED ORDER — LOSARTAN POTASSIUM 100 MG PO TABS: 100.0000 mg | ORAL_TABLET | Freq: Every day | ORAL | 1 refills | Status: DC

## 2016-06-29 NOTE — Progress Notes (Signed)
BP 125/76   Pulse 73   Temp (!) 96.6 F (35.9 C) (Oral)   Ht 5' 7"  (1.702 m)   Wt 260 lb (117.9 kg)   LMP 06/25/2016   BMI 40.72 kg/m    Subjective:    Patient ID: Bethany Richardson, female    DOB: April 15, 1971, 46 y.o.   MRN: [id number]  HPI: GLENISHA GUNDRY is a 46 y.o. female presenting on 06/29/2016 for Wound Check   HPI Well adult exam and hypertension recheck and fasting labs Patient is coming in today for well adult exam and hypertension recheck and fasting labs. She got back on her Maxide but she says that the Maxzide just makes her feel fatigued. She says before she was on lisinopril and she really liked that was on it for years but the Maxzide just makes him feel more fatigued. She had to stop the lisinopril previously because of cough problems but she would like to be on something else if possible. Her blood pressure today is 125/76 and is controlled. She denies any lightheadedness or dizziness. Patient denies headaches, blurred vision, chest pains, shortness of breath, or weakness. Denies any side effects from medication and is content with current medication.   The abdominal pains that she's been having we want to do a KUB and will get that done today. The abdominal pains of continued to be on the left side and crampy and bloated in nature and she feels like she may would have some swelling on the left side.  Relevant past medical, surgical, family and social history reviewed and updated as indicated. Interim medical history since our last visit reviewed. Allergies and medications reviewed and updated.  Review of Systems  Constitutional: Positive for fatigue. Negative for chills and fever.  Respiratory: Negative for chest tightness and shortness of breath.   Cardiovascular: Negative for chest pain and leg swelling.  Gastrointestinal: Positive for abdominal pain, constipation and nausea. Negative for anal bleeding, blood in stool, diarrhea and vomiting.  Genitourinary:  Negative for difficulty urinating and dysuria.  Musculoskeletal: Negative for back pain and gait problem.  Skin: Negative for rash.  Neurological: Negative for light-headedness and headaches.  Psychiatric/Behavioral: Negative for agitation and behavioral problems.  All other systems reviewed and are negative.   Per HPI unless specifically indicated above   Allergies as of 06/29/2016      Reactions   Ace Inhibitors Cough      Medication List       Accurate as of 06/29/16  9:02 AM. Always use your most recent med list.          dicyclomine 20 MG tablet Commonly known as:  BENTYL Take 1 tablet (20 mg total) by mouth every 6 (six) hours.   omeprazole 40 MG capsule Commonly known as:  PRILOSEC TAKE ONE CAPSULE BY MOUTH ONCE DAILY   triamterene-hydrochlorothiazide 37.5-25 MG tablet Commonly known as:  MAXZIDE-25 Take 1 tablet by mouth daily.          Objective:    BP 125/76   Pulse 73   Temp (!) 96.6 F (35.9 C) (Oral)   Ht 5' 7"  (1.702 m)   Wt 260 lb (117.9 kg)   LMP 06/25/2016   BMI 40.72 kg/m   Wt Readings from Last 3 Encounters:  06/29/16 260 lb (117.9 kg)  06/26/16 264 lb 9.6 oz (120 kg)  08/26/15 260 lb (117.9 kg)    Physical Exam  Constitutional: She is oriented to person, place, and time.  She appears well-developed and well-nourished. No distress.  Eyes: Conjunctivae are normal.  Neck: Neck supple. No thyromegaly present.  Cardiovascular: Normal rate, regular rhythm, normal heart sounds and intact distal pulses.   No murmur heard. Pulmonary/Chest: Effort normal and breath sounds normal. No respiratory distress. She has no wheezes. She has no rales.  Abdominal: Soft. Bowel sounds are normal. She exhibits no distension. There is no hepatosplenomegaly. There is tenderness in the left upper quadrant and left lower quadrant. There is no rigidity, no rebound, no guarding, no CVA tenderness, no tenderness at McBurney's point and negative Murphy's sign.    Musculoskeletal: Normal range of motion. She exhibits no edema or tenderness.  Lymphadenopathy:    She has no cervical adenopathy.  Neurological: She is alert and oriented to person, place, and time. Coordination normal.  Skin: Skin is warm and dry. No rash noted. She is not diaphoretic.  Psychiatric: She has a normal mood and affect. Her behavior is normal.  Nursing note and vitals reviewed.  KUB: Small right renal calculus, phleboliths, moderate stool throughout colon.    Assessment & Plan:   Problem List Items Addressed This Visit    None    Visit Diagnoses    Well adult exam    -  Primary   Relevant Orders   CBC with Differential/Platelet   Lipid panel   BMP8+EGFR   Essential hypertension, benign       Relevant Orders   Lipase   BMP8+EGFR   Prediabetes       Relevant Orders   Lipid panel   Bayer DCA Hb A1c Waived   Left lateral abdominal pain       KUB shows moderate stool throughout, recommended MiraLAX daily until cleared and having regular bowel movements.   Relevant Orders   CBC with Differential/Platelet       Follow up plan: Return in about 4 weeks (around 07/27/2016), or if symptoms worsen or fail to improve, for Hypertension recheck.  Counseling provided for all of the vaccine components Orders Placed This Encounter  Procedures  . CBC with Differential/Platelet  . Lipase  . Lipid panel  . BMP8+EGFR  . Bayer [facility] Hb A1c [place], MD [facility] 06/29/2016, 9:02 AM

## 2016-06-29 NOTE — Addendum Note (Signed)
Addended by: Caryl Pina on: 06/29/2016 09:08 AM   Modules accepted: Orders

## 2016-06-30 LAB — CBC WITH DIFFERENTIAL/PLATELET
BASOS: 1 %
Basophils Absolute: 0.1 10*3/uL (ref 0.0–0.2)
EOS (ABSOLUTE): 0.3 10*3/uL (ref 0.0–0.4)
Eos: 4 %
HEMATOCRIT: 38.3 % (ref 34.0–46.6)
HEMOGLOBIN: 13 g/dL (ref 11.1–15.9)
IMMATURE GRANS (ABS): 0 10*3/uL (ref 0.0–0.1)
Immature Granulocytes: 0 %
LYMPHS ABS: 1.3 10*3/uL (ref 0.7–3.1)
LYMPHS: 23 %
MCH: 26.1 pg — AB (ref 26.6–33.0)
MCHC: 33.9 g/dL (ref 31.5–35.7)
MCV: 77 fL — AB (ref 79–97)
MONOCYTES: 10 %
Monocytes Absolute: 0.6 10*3/uL (ref 0.1–0.9)
NEUTROS ABS: 3.6 10*3/uL (ref 1.4–7.0)
Neutrophils: 62 %
Platelets: 208 10*3/uL (ref 150–379)
RBC: 4.98 x10E6/uL (ref 3.77–5.28)
RDW: 16.9 % — ABNORMAL HIGH (ref 12.3–15.4)
WBC: 5.8 10*3/uL (ref 3.4–10.8)

## 2016-06-30 LAB — BMP8+EGFR
BUN / CREAT RATIO: 12 (ref 9–23)
BUN: 9 mg/dL (ref 6–24)
CHLORIDE: 99 mmol/L (ref 96–106)
CO2: 26 mmol/L (ref 18–29)
Calcium: 8.7 mg/dL (ref 8.7–10.2)
Creatinine, Ser: 0.73 mg/dL (ref 0.57–1.00)
GFR calc non Af Amer: 100 mL/min/{1.73_m2} (ref >59)
GFR, EST AFRICAN AMERICAN: 116 mL/min/{1.73_m2} (ref >59)
GLUCOSE: 156 mg/dL — AB (ref 65–99)
POTASSIUM: 3.6 mmol/L (ref 3.5–5.2)
SODIUM: 140 mmol/L (ref 134–144)

## 2016-06-30 LAB — LIPID PANEL
CHOL/HDL RATIO: 3.7 ratio (ref 0.0–4.4)
Cholesterol, Total: 167 mg/dL (ref 100–199)
HDL: 45 mg/dL (ref >39)
LDL CALC: 97 mg/dL (ref 0–99)
Triglycerides: 123 mg/dL (ref 0–149)
VLDL CHOLESTEROL CAL: 25 mg/dL (ref 5–40)

## 2016-06-30 LAB — LIPASE: Lipase: 50 U/L (ref 14–72)

## 2016-07-03 ENCOUNTER — Ambulatory Visit (INDEPENDENT_AMBULATORY_CARE_PROVIDER_SITE_OTHER): Payer: BLUE CROSS/BLUE SHIELD | Admitting: Family Medicine

## 2016-07-03 ENCOUNTER — Encounter: Payer: Self-pay | Admitting: Family Medicine

## 2016-07-03 VITALS — BP 139/88 | HR 101 | Temp 97.5°F | Ht 67.0 in | Wt 258.8 lb

## 2016-07-03 DIAGNOSIS — J200 Acute bronchitis due to Mycoplasma pneumoniae: Secondary | ICD-10-CM | POA: Diagnosis not present

## 2016-07-03 DIAGNOSIS — R6883 Chills (without fever): Secondary | ICD-10-CM | POA: Diagnosis not present

## 2016-07-03 LAB — VERITOR FLU A/B WAIVED
Influenza A: NEGATIVE
Influenza B: NEGATIVE

## 2016-07-03 MED ORDER — AZITHROMYCIN 250 MG PO TABS: ORAL_TABLET | ORAL | 0 refills | Status: DC

## 2016-07-03 NOTE — Patient Instructions (Addendum)
Great to see you!   Acute Bronchitis, Adult Acute bronchitis is when air tubes (bronchi) in the lungs suddenly get swollen. The condition can make it hard to breathe. It can also cause these symptoms:  A cough.  Coughing up clear, yellow, or green mucus.  Wheezing.  Chest congestion.  Shortness of breath.  A fever.  Body aches.  Chills.  A sore throat.  Follow these instructions at home: Medicines  Take over-the-counter and prescription medicines only as told by your doctor.  If you were prescribed an antibiotic medicine, take it as told by your doctor. Do not stop taking the antibiotic even if you start to feel better. General instructions  Rest.  Drink enough fluids to keep your pee (urine) clear or pale yellow.  Avoid smoking and secondhand smoke. If you smoke and you need help quitting, ask your doctor. Quitting will help your lungs heal faster.  Use an inhaler, cool mist vaporizer, or humidifier as told by your doctor.  Keep all follow-up visits as told by your doctor. This is important. How is this prevented? To lower your risk of getting this condition again:  Wash your hands often with soap and water. If you cannot use soap and water, use hand sanitizer.  Avoid contact with people who have cold symptoms.  Try not to touch your hands to your mouth, nose, or eyes.  Make sure to get the flu shot every year.  Contact a doctor if:  Your symptoms do not get better in 2 weeks. Get help right away if:  You cough up blood.  You have chest pain.  You have very bad shortness of breath.  You become dehydrated.  You faint (pass out) or keep feeling like you are going to pass out.  You keep throwing up (vomiting).  You have a very bad headache.  Your fever or chills gets worse. This information is not intended to replace advice given to you by your health care provider. Make sure you discuss any questions you have with your health care  provider. Document Released: 10/21/2007 Document Revised: 12/11/2015 Document Reviewed: 10/23/2015 Elsevier Interactive Patient Education  2017 Elsevier Inc.  

## 2016-07-03 NOTE — Progress Notes (Signed)
   HPI  Patient presents today with acute illness.  Patient explains that about 2 weeks ago she had a flulike illness that lasted 3-4 days improving with Mucinex and Tylenol only.  She explains that about 5 days ago she developed another illness with cough, congestion, headache, chills. Positive body aches as well as mild dyspnea.  Patient states that she seems to be getting worse and she had malaise the last 24-36 hours.  PMH: Smoking status noted ROS: Per HPI  Objective: BP 139/88   Pulse (!) 101   Temp 97.5 F (36.4 C) (Oral)   Ht 5' 7"  (1.702 m)   Wt 258 lb 12.8 oz (117.4 kg)   LMP 06/25/2016   BMI 40.53 kg/m  Gen: NAD, alert, cooperative with exam HEENT: NCAT, EOMI, PERRL CV: RRR, good S1/S2, no murmur Resp: CTABL, no wheezes, non-labored Ext: No edema, warm Neuro: Alert and oriented, No gross deficits  Assessment and plan:  # acute bronchitis Considering 2 week course and steadily worsening symptoms with severe malaise I think it's most appropriate to go ahead and cover with antibiotics Azithromycin to cover CAP VS mycoplasma Clinical exam is reassuring Discussed usual course of illness and low threshold for follow-up. Rapid flu is negative today.    Orders Placed This Encounter  Procedures  . Veritor Flu A/B Waived    Order Specific Question:   Source    Answer:   nasal    Meds ordered this encounter  Medications  . azithromycin (ZITHROMAX) 250 MG tablet    Sig: Take 2 tablets on day 1 and 1 tablet daily after that    Dispense:  6 tablet    Refill:  [place], MD [facility] 07/03/2016, 2:44 PM

## 2016-07-06 ENCOUNTER — Telehealth: Payer: Self-pay | Admitting: Family Medicine

## 2016-07-06 NOTE — Telephone Encounter (Signed)
Returned pt's phone call earlier after lunch. TC was not seen at that time to document

## 2016-07-08 ENCOUNTER — Ambulatory Visit: Payer: BLUE CROSS/BLUE SHIELD | Admitting: Pharmacist

## 2016-07-22 ENCOUNTER — Encounter: Payer: Self-pay | Admitting: Family Medicine

## 2016-07-22 ENCOUNTER — Ambulatory Visit (INDEPENDENT_AMBULATORY_CARE_PROVIDER_SITE_OTHER): Payer: BLUE CROSS/BLUE SHIELD | Admitting: Family Medicine

## 2016-07-22 VITALS — BP 126/88 | HR 67 | Temp 97.9°F | Ht 67.0 in | Wt 255.1 lb

## 2016-07-22 DIAGNOSIS — I10 Essential (primary) hypertension: Secondary | ICD-10-CM | POA: Diagnosis not present

## 2016-07-22 DIAGNOSIS — E119 Type 2 diabetes mellitus without complications: Secondary | ICD-10-CM | POA: Diagnosis not present

## 2016-07-22 MED ORDER — ESOMEPRAZOLE MAGNESIUM 40 MG PO CPDR: 40.0000 mg | DELAYED_RELEASE_CAPSULE | Freq: Every day | ORAL | 3 refills | Status: DC

## 2016-07-22 NOTE — Assessment & Plan Note (Signed)
Patient is doing diet and exercise

## 2016-07-22 NOTE — Progress Notes (Signed)
BP 126/88   Pulse 67   Temp 97.9 F (36.6 C) (Oral)   Ht 5' 7"  (1.702 m)   Wt 255 lb 2 oz (115.7 kg)   LMP 06/25/2016   BMI 39.96 kg/m    Subjective:    Patient ID: Bethany Richardson, female    DOB: 1971-02-15, 46 y.o.   MRN: [id number]  HPI: Bethany Richardson is a 46 y.o. female presenting on 07/22/2016 for Hypertension (4 week followup) and Right ear pain   HPI Hypertension recheck Patient's blood pressure today is 126/88. She is currently back on her Maxzide and she denies side effects that she was having previously from it. She is happy with her Maxide would like to keep it going. Patient denies headaches, blurred vision, chest pains, shortness of breath, or weakness. Denies any side effects from medication and is content with current medication.   Type 2 diabetes mellitus Patient comes in today for recheck of her diabetes. Patient has been currently taking no medication and would like to try diet control first. This is a newer diagnosis and her A1c was 7.3.. Patient is currently on an ACE inhibitor. Patient has not seen an ophthalmologist this year. Patient denies any issues with his feet.   Relevant past medical, surgical, family and social history reviewed and updated as indicated. Interim medical history since our last visit reviewed. Allergies and medications reviewed and updated.  Review of Systems  Constitutional: Negative for chills and fever.  HENT: Negative for congestion, ear discharge and ear pain.   Eyes: Negative for redness and visual disturbance.  Respiratory: Negative for chest tightness and shortness of breath.   Cardiovascular: Negative for chest pain and leg swelling.  Musculoskeletal: Negative for back pain and gait problem.  Skin: Negative for rash.  Neurological: Negative for light-headedness and headaches.  Psychiatric/Behavioral: Negative for agitation and behavioral problems.  All other systems reviewed and are negative.   Per HPI unless specifically  indicated above   Allergies as of 07/22/2016      Reactions   Ace Inhibitors Cough      Medication List       Accurate as of 07/22/16  9:26 AM. Always use your most recent med list.          dicyclomine 20 MG tablet Commonly known as:  BENTYL Take 1 tablet (20 mg total) by mouth every 6 (six) hours.   esomeprazole 40 MG capsule Commonly known as:  NEXIUM Take 1 capsule (40 mg total) by mouth daily at 12 noon.   triamterene-hydrochlorothiazide 37.5-25 MG tablet Commonly known as:  MAXZIDE-25 Take 1 tablet by mouth daily.          Objective:    BP 126/88   Pulse 67   Temp 97.9 F (36.6 C) (Oral)   Ht 5' 7"  (1.702 m)   Wt 255 lb 2 oz (115.7 kg)   LMP 06/25/2016   BMI 39.96 kg/m   Wt Readings from Last 3 Encounters:  07/22/16 255 lb 2 oz (115.7 kg)  07/03/16 258 lb 12.8 oz (117.4 kg)  06/29/16 260 lb (117.9 kg)    Physical Exam  Constitutional: She is oriented to person, place, and time. She appears well-developed and well-nourished. No distress.  Eyes: Conjunctivae are normal.  Neck: Neck supple.  Cardiovascular: Normal rate, regular rhythm, normal heart sounds and intact distal pulses.   No murmur heard. Pulmonary/Chest: Effort normal and breath sounds normal. No respiratory distress. She has no wheezes. She has  no rales.  Musculoskeletal: Normal range of motion. She exhibits no edema or tenderness.  Lymphadenopathy:    She has no cervical adenopathy.  Neurological: She is alert and oriented to person, place, and time. Coordination normal.  Skin: Skin is warm and dry. No rash noted. She is not diaphoretic.  Psychiatric: She has a normal mood and affect. Her behavior is normal.  Nursing note and vitals reviewed.     Assessment & Plan:   Problem List Items Addressed This Visit      Cardiovascular and Mediastinum   Essential hypertension, benign - Primary     Endocrine   Type 2 diabetes mellitus without complication, without long-term current use of  insulin ([place])    Patient is doing diet and exercise         Follow up plan: Return in about 3 months (around 10/22/2016), or if symptoms worsen or fail to improve, for Recheck diabetes and hypertension.  Counseling provided for all of the vaccine components No orders of the defined types were placed in this encounter.   Caryl Pina, MD [facility] 07/22/2016, 9:26 AM

## 2016-07-25 ENCOUNTER — Other Ambulatory Visit: Payer: Self-pay | Admitting: Family Medicine

## 2016-07-25 DIAGNOSIS — I10 Essential (primary) hypertension: Secondary | ICD-10-CM

## 2016-07-27 ENCOUNTER — Ambulatory Visit: Payer: BLUE CROSS/BLUE SHIELD | Admitting: Family Medicine

## 2016-07-27 MED ORDER — TRIAMTERENE-HCTZ 37.5-25 MG PO TABS: 1.0000 | ORAL_TABLET | Freq: Every day | ORAL | 1 refills | Status: DC

## 2016-07-27 NOTE — Telephone Encounter (Signed)
LMOVM esomeprazole was sent on the 7th. I sent the Maxzide today

## 2016-08-05 ENCOUNTER — Ambulatory Visit: Payer: BLUE CROSS/BLUE SHIELD | Admitting: Pharmacist

## 2016-08-20 ENCOUNTER — Ambulatory Visit: Payer: BLUE CROSS/BLUE SHIELD | Admitting: Pharmacist

## 2016-08-21 ENCOUNTER — Encounter: Payer: Self-pay | Admitting: Family Medicine

## 2016-09-08 ENCOUNTER — Ambulatory Visit (INDEPENDENT_AMBULATORY_CARE_PROVIDER_SITE_OTHER): Payer: BLUE CROSS/BLUE SHIELD | Admitting: Family Medicine

## 2016-09-08 ENCOUNTER — Encounter: Payer: Self-pay | Admitting: Family Medicine

## 2016-09-08 VITALS — BP 122/78 | HR 83 | Temp 97.4°F | Ht 67.0 in | Wt 248.0 lb

## 2016-09-08 DIAGNOSIS — R42 Dizziness and giddiness: Secondary | ICD-10-CM

## 2016-09-08 DIAGNOSIS — J0120 Acute ethmoidal sinusitis, unspecified: Secondary | ICD-10-CM

## 2016-09-08 DIAGNOSIS — R0789 Other chest pain: Secondary | ICD-10-CM | POA: Diagnosis not present

## 2016-09-08 MED ORDER — BETAMETHASONE SOD PHOS & ACET 6 (3-3) MG/ML IJ SUSP
6.0000 mg | Freq: Once | INTRAMUSCULAR | Status: AC
  Administered 2016-09-08: 6 mg via INTRAMUSCULAR

## 2016-09-08 MED ORDER — CEFPROZIL 500 MG PO TABS: 500.0000 mg | ORAL_TABLET | Freq: Two times a day (BID) | ORAL | 0 refills | Status: DC

## 2016-09-08 NOTE — Progress Notes (Signed)
Subjective:  Patient ID: AVERLEE SWARTZ, female    DOB: 1970/10/13  Age: 46 y.o. MRN: [id number]  CC: Dizziness (pt here today c/o dizzy spells since she woke up this morning and some chest pain with sinus pressure.)   HPI AREATHA KALATA presents for Onset today of dizziness that she describes as feeling swimmy headed. She says that when she bends over she feels like she's got black out. She's had bitemporal headache. She has pain around the eyes and headaches. There has been no fever chills or sweats. However she's felt hot when she is dizzy when she gets nauseous. She denies any room spinning/vertigo. There has been some upper respiratory congestion with no rhinorrhea noted. She ate 4 slices of pizza last night. This morning she had some substernal chest pain. It was not radiating. It did not seem to be related to activity. It did come and go 2-3 times lasting a few minutes each time. She does describe it as pressure.  History Raelea has a past medical history of Adenomyosis; Chronic bronchitis ([place]); Depression; GERD (gastroesophageal reflux disease); Hypertension; Menorrhagia; Obesity; and Pneumonia (2013).   She has a past surgical history that includes Tubal ligation (Bilateral, 1995); Hysteroscopy with novasure (N/A, 10/19/2014); and vaginal ablation (June 2016).   Her family history includes Hypertension in her father and mother.She reports that she quit smoking about 7 years ago. Her smoking use included Cigarettes. She has a 40.00 pack-year smoking history. She has never used smokeless tobacco. She reports that she drinks alcohol. She reports that she does not use drugs.  Current Outpatient Prescriptions on File Prior to Visit  Medication Sig Dispense Refill  . esomeprazole (NEXIUM) 40 MG capsule Take 1 capsule (40 mg total) by mouth daily at 12 noon. 90 capsule 3  . triamterene-hydrochlorothiazide (MAXZIDE-25) 37.5-25 MG tablet TAKE 1 TABLET BY MOUTH ONCE DAILY (NEEDS TO BE SEEN FOR  REFILLS) 30 tablet 0   No current facility-administered medications on file prior to visit.     ROS Review of Systems  Constitutional: Negative for appetite change, chills, diaphoresis, fatigue and fever.  HENT: Positive for congestion and sinus pressure. Negative for hearing loss, postnasal drip, rhinorrhea, sore throat and trouble swallowing.   Respiratory: Negative for cough, chest tightness and shortness of breath.   Cardiovascular: Positive for chest pain. Negative for palpitations.  Gastrointestinal: Negative for abdominal pain.  Musculoskeletal: Negative for arthralgias.  Skin: Negative for rash.  Neurological: Positive for dizziness and headaches.    Objective:  BP 122/78   Pulse 83   Temp 97.4 F (36.3 C) (Oral)   Ht 5' 7"  (1.702 m)   Wt 248 lb (112.5 kg)   BMI 38.84 kg/m   Physical Exam  Constitutional: She is oriented to person, place, and time. She appears well-developed and well-nourished.  HENT:  Head: Normocephalic and atraumatic.  Right Ear: Tympanic membrane and external ear normal. No decreased hearing is noted.  Left Ear: Tympanic membrane and external ear normal. No decreased hearing is noted.  Nose: Mucosal edema present. Right sinus exhibits no frontal sinus tenderness. Left sinus exhibits no frontal sinus tenderness.  Mouth/Throat: No oropharyngeal exudate or posterior oropharyngeal erythema.  Neck: No Brudzinski's sign noted.  Pulmonary/Chest: Breath sounds normal. No respiratory distress.  Musculoskeletal: Normal range of motion.  Lymphadenopathy:       Head (right side): No preauricular adenopathy present.       Head (left side): No preauricular adenopathy present.  Right cervical: No superficial cervical adenopathy present.      Left cervical: No superficial cervical adenopathy present.  Neurological: She is alert and oriented to person, place, and time. No cranial nerve deficit. Coordination normal.  Skin: Skin is warm and dry.    Psychiatric: She has a normal mood and affect. Her behavior is normal. Thought content normal.    Assessment & Plan:   Dashae was seen today for dizziness.  Diagnoses and all orders for this visit:  Other chest pain -     EKG 12-Lead  Acute ethmoidal sinusitis, recurrence not specified -     betamethasone acetate-betamethasone sodium phosphate (CELESTONE) injection 6 mg; Inject 1 mL (6 mg total) into the muscle once.  Dizziness -     CBC with Differential/Platelet -     CMP14+EGFR  Other orders -     cefPROZIL (CEFZIL) 500 MG tablet; Take 1 tablet (500 mg total) by mouth 2 (two) times daily. For infection. Take all of this medication.   I have discontinued Ms. Boldin's dicyclomine. I am also having her start on cefPROZIL. Additionally, I am having her maintain her esomeprazole and triamterene-hydrochlorothiazide. We administered betamethasone acetate-betamethasone sodium phosphate.  Meds ordered this encounter  Medications  . cefPROZIL (CEFZIL) 500 MG tablet    Sig: Take 1 tablet (500 mg total) by mouth 2 (two) times daily. For infection. Take all of this medication.    Dispense:  20 tablet    Refill:  0  . betamethasone acetate-betamethasone sodium phosphate (CELESTONE) injection 6 mg   EKG shows no ischemic changes. There is a nonspecific intraventricular conduction delay.  Follow-up: Return if symptoms worsen or fail to improve.  Claretta Fraise, M.D.

## 2016-09-10 LAB — CMP14+EGFR
A/G RATIO: 1.7 (ref 1.2–2.2)
ALK PHOS: 80 IU/L (ref 39–117)
ALT: 27 IU/L (ref 0–32)
AST: 16 IU/L (ref 0–40)
Albumin: 4 g/dL (ref 3.5–5.5)
BUN / CREAT RATIO: 17 (ref 9–23)
BUN: 14 mg/dL (ref 6–24)
Bilirubin Total: 0.4 mg/dL (ref 0.0–1.2)
CALCIUM: 9.9 mg/dL (ref 8.7–10.2)
CO2: 25 mmol/L (ref 18–29)
CREATININE: 0.84 mg/dL (ref 0.57–1.00)
Chloride: 97 mmol/L (ref 96–106)
GFR calc Af Amer: 98 mL/min/{1.73_m2} (ref >59)
GFR, EST NON AFRICAN AMERICAN: 85 mL/min/{1.73_m2} (ref >59)
GLOBULIN, TOTAL: 2.3 g/dL (ref 1.5–4.5)
Glucose: 132 mg/dL — ABNORMAL HIGH (ref 65–99)
POTASSIUM: 3.7 mmol/L (ref 3.5–5.2)
SODIUM: 139 mmol/L (ref 134–144)
Total Protein: 6.3 g/dL (ref 6.0–8.5)

## 2016-09-10 LAB — CBC WITH DIFFERENTIAL/PLATELET
BASOS: 1 %
Basophils Absolute: 0.1 10*3/uL (ref 0.0–0.2)
EOS (ABSOLUTE): 0.2 10*3/uL (ref 0.0–0.4)
EOS: 2 %
HEMATOCRIT: 40.1 % (ref 34.0–46.6)
HEMOGLOBIN: 13.5 g/dL (ref 11.1–15.9)
Immature Grans (Abs): 0 10*3/uL (ref 0.0–0.1)
Immature Granulocytes: 0 %
LYMPHS ABS: 2.2 10*3/uL (ref 0.7–3.1)
Lymphs: 25 %
MCH: 25.4 pg — ABNORMAL LOW (ref 26.6–33.0)
MCHC: 33.7 g/dL (ref 31.5–35.7)
MCV: 75 fL — AB (ref 79–97)
MONOCYTES: 8 %
MONOS ABS: 0.7 10*3/uL (ref 0.1–0.9)
Neutrophils Absolute: 5.8 10*3/uL (ref 1.4–7.0)
Neutrophils: 64 %
Platelets: 207 10*3/uL (ref 150–379)
RBC: 5.32 x10E6/uL — AB (ref 3.77–5.28)
RDW: 15.5 % — AB (ref 12.3–15.4)
WBC: 8.9 10*3/uL (ref 3.4–10.8)

## 2016-09-27 ENCOUNTER — Other Ambulatory Visit: Payer: Self-pay | Admitting: Family Medicine

## 2016-10-23 ENCOUNTER — Ambulatory Visit: Payer: BLUE CROSS/BLUE SHIELD | Admitting: Family Medicine

## 2016-11-23 ENCOUNTER — Other Ambulatory Visit: Payer: Self-pay | Admitting: Family Medicine

## 2016-11-23 DIAGNOSIS — R109 Unspecified abdominal pain: Secondary | ICD-10-CM

## 2016-11-24 NOTE — Telephone Encounter (Signed)
Last seen 09/08/16  Dr Livia Snellen

## 2017-02-12 ENCOUNTER — Other Ambulatory Visit: Payer: Self-pay | Admitting: Family Medicine

## 2017-02-17 ENCOUNTER — Other Ambulatory Visit: Payer: Self-pay | Admitting: Family Medicine

## 2017-02-26 ENCOUNTER — Other Ambulatory Visit: Payer: Self-pay | Admitting: Family Medicine

## 2017-05-27 ENCOUNTER — Ambulatory Visit: Payer: BLUE CROSS/BLUE SHIELD | Admitting: Obstetrics & Gynecology

## 2017-06-06 ENCOUNTER — Other Ambulatory Visit: Payer: Self-pay | Admitting: Family Medicine

## 2017-06-06 DIAGNOSIS — R109 Unspecified abdominal pain: Secondary | ICD-10-CM

## 2017-06-07 NOTE — Telephone Encounter (Signed)
Last seen 09/08/16  Dr Livia Snellen

## 2017-06-08 NOTE — Telephone Encounter (Signed)
Authorize 30 days only. Then contact the patient letting them know that they will need an appointment before any further prescriptions can be sent in. 

## 2017-07-05 ENCOUNTER — Other Ambulatory Visit: Payer: Self-pay | Admitting: Family Medicine

## 2017-07-05 NOTE — Telephone Encounter (Signed)
Last seen 09/08/16  Dr Livia Snellen

## 2017-08-20 ENCOUNTER — Other Ambulatory Visit: Payer: Self-pay | Admitting: Family Medicine

## 2017-08-20 NOTE — Telephone Encounter (Signed)
Authorize 30 days only. Then contact the patient letting them know that they will need an appointment before any further prescriptions can be sent in. 

## 2017-09-20 ENCOUNTER — Other Ambulatory Visit: Payer: Self-pay | Admitting: Family Medicine

## 2017-09-20 DIAGNOSIS — R109 Unspecified abdominal pain: Secondary | ICD-10-CM

## 2017-09-24 ENCOUNTER — Other Ambulatory Visit: Payer: Self-pay | Admitting: Family Medicine

## 2017-09-24 DIAGNOSIS — R109 Unspecified abdominal pain: Secondary | ICD-10-CM

## 2017-09-29 ENCOUNTER — Other Ambulatory Visit: Payer: Self-pay | Admitting: Family Medicine

## 2017-09-29 DIAGNOSIS — R109 Unspecified abdominal pain: Secondary | ICD-10-CM

## 2017-10-01 ENCOUNTER — Other Ambulatory Visit: Payer: Self-pay | Admitting: Family Medicine

## 2017-10-04 ENCOUNTER — Telehealth: Payer: Self-pay | Admitting: Family Medicine

## 2017-10-04 MED ORDER — TRIAMTERENE-HCTZ 37.5-25 MG PO TABS: 1.0000 | ORAL_TABLET | Freq: Every day | ORAL | 0 refills | Status: DC

## 2017-10-04 NOTE — Telephone Encounter (Signed)
Please review and advise Stacks pt Last seen 08/2016

## 2017-10-17 ENCOUNTER — Other Ambulatory Visit: Payer: Self-pay | Admitting: Family Medicine

## 2017-10-17 DIAGNOSIS — R109 Unspecified abdominal pain: Secondary | ICD-10-CM

## 2017-10-27 ENCOUNTER — Ambulatory Visit: Payer: BLUE CROSS/BLUE SHIELD | Admitting: Family Medicine

## 2017-10-28 ENCOUNTER — Ambulatory Visit (INDEPENDENT_AMBULATORY_CARE_PROVIDER_SITE_OTHER): Payer: BLUE CROSS/BLUE SHIELD | Admitting: Family Medicine

## 2017-10-28 ENCOUNTER — Encounter: Payer: Self-pay | Admitting: Family Medicine

## 2017-10-28 VITALS — BP 158/89 | HR 80 | Temp 99.5°F | Ht 67.0 in | Wt 259.0 lb

## 2017-10-28 DIAGNOSIS — D1721 Benign lipomatous neoplasm of skin and subcutaneous tissue of right arm: Secondary | ICD-10-CM

## 2017-10-28 DIAGNOSIS — N6320 Unspecified lump in the left breast, unspecified quadrant: Secondary | ICD-10-CM | POA: Diagnosis not present

## 2017-10-28 DIAGNOSIS — I10 Essential (primary) hypertension: Secondary | ICD-10-CM

## 2017-10-28 MED ORDER — TRIAMTERENE-HCTZ 37.5-25 MG PO TABS: 1.0000 | ORAL_TABLET | Freq: Every day | ORAL | 0 refills | Status: DC

## 2017-10-28 NOTE — Progress Notes (Signed)
BP (!) 158/89   Pulse 80   Temp 99.5 F (37.5 C) (Oral)   Ht 5' 7"  (1.702 m)   Wt 259 lb (117.5 kg)   BMI 40.57 kg/m    Subjective:    Patient ID: Bethany Richardson, female    DOB: August 31, 1970, 47 y.o.   MRN: [id number]  HPI: Bethany Richardson is a 47 y.o. female presenting on 10/28/2017 for Refill BP med, edema in lower legs (out of meds x 2 days) and Knot on right upper arm (x 1 year, varies in size - seems to enlarge and then will go down)   HPI Hypertension Patient is currently on triamterene hydrochlorothiazide but she has been out of it recently, and their blood pressure today is 158/89. Patient denies any lightheadedness or dizziness. Patient denies headaches, blurred vision, chest pains, shortness of breath, or weakness. Denies any side effects from medication and is content with current medication.  Patient gets swelling of lower legs and needs her blood pressure medication back so that it can help with that and control her blood pressure  Left breast lump Left breast lump over the past couple months.  The breast lump is nontender and is on the outer aspect of her left breast.  She does not have any skin changes or drainage that she is noted.  She felt to be couple months ago but does not know exactly when it started and she has not noticed any changes in size of it.  Lump on right upper arm Has a small lump on her right inner upper arm that she has had for quite some time, at least a year and she notices that it will increase in size and then go back down.  It is not in her axilla but just anterior to axilla on her right upper arm.  She denies any pain or fevers or chills or abnormal rashes overlying skin changes or redness or warmth with it.  Relevant past medical, surgical, family and social history reviewed and updated as indicated. Interim medical history since our last visit reviewed. Allergies and medications reviewed and updated.  Review of Systems  Constitutional:  Negative for chills and fever.  Eyes: Negative for visual disturbance.  Respiratory: Negative for chest tightness and shortness of breath.   Cardiovascular: Negative for chest pain and leg swelling.  Musculoskeletal: Negative for back pain and gait problem.  Skin: Negative for color change and rash.  Neurological: Negative for light-headedness and headaches.  Psychiatric/Behavioral: Negative for agitation and behavioral problems.  All other systems reviewed and are negative.   Per HPI unless specifically indicated above   Allergies as of 10/28/2017      Reactions   Ace Inhibitors Cough      Medication List        Accurate as of 10/28/17  4:47 PM. Always use your most recent med list.          triamterene-hydrochlorothiazide 37.5-25 MG tablet Commonly known as:  MAXZIDE-25 Take 1 tablet by mouth daily.          Objective:    BP (!) 158/89   Pulse 80   Temp 99.5 F (37.5 C) (Oral)   Ht 5' 7"  (1.702 m)   Wt 259 lb (117.5 kg)   BMI 40.57 kg/m   Wt Readings from Last 3 Encounters:  10/28/17 259 lb (117.5 kg)  09/08/16 248 lb (112.5 kg)  07/22/16 255 lb 2 oz (115.7 kg)    Physical Exam  Constitutional: She is oriented to person, place, and time. She appears well-developed and well-nourished. No distress.  Eyes: Conjunctivae are normal.  Cardiovascular: Normal rate, regular rhythm, normal heart sounds and intact distal pulses.  No murmur heard. Pulmonary/Chest: Effort normal and breath sounds normal. No respiratory distress. She has no wheezes.    Musculoskeletal: Normal range of motion.  Neurological: She is alert and oriented to person, place, and time. Coordination normal.  Skin: Skin is warm and dry. No rash noted. She is not diaphoretic.     Psychiatric: She has a normal mood and affect. Her behavior is normal.  Nursing note and vitals reviewed.       Assessment & Plan:   Problem List Items Addressed This Visit      Cardiovascular and Mediastinum     Essential hypertension, benign   Relevant Medications   triamterene-hydrochlorothiazide (MAXZIDE-25) 37.5-25 MG tablet    Other Visit Diagnoses    Lump of breast, left    -  Primary   Relevant Orders   MM DIAG BREAST TOMO BILATERAL   Lipoma of right upper extremity       left arm upper arm       Follow up plan: Return if symptoms worsen or fail to improve.  Counseling provided for all of the vaccine components Orders Placed This Encounter  Procedures  . MM Digital Diagnostic Bilat    Caryl Pina, MD [facility] 10/28/2017, 4:47 PM

## 2017-11-02 ENCOUNTER — Other Ambulatory Visit: Payer: Self-pay | Admitting: Family Medicine

## 2017-11-02 DIAGNOSIS — N6320 Unspecified lump in the left breast, unspecified quadrant: Secondary | ICD-10-CM

## 2017-11-04 ENCOUNTER — Other Ambulatory Visit: Payer: Self-pay | Admitting: Family Medicine

## 2017-11-04 ENCOUNTER — Telehealth: Payer: Self-pay | Admitting: Family Medicine

## 2017-11-04 DIAGNOSIS — N6310 Unspecified lump in the right breast, unspecified quadrant: Secondary | ICD-10-CM

## 2017-11-05 NOTE — Telephone Encounter (Signed)
Pt  Aware of appointment date/time

## 2017-11-09 ENCOUNTER — Encounter (HOSPITAL_COMMUNITY): Payer: Self-pay

## 2017-11-11 ENCOUNTER — Ambulatory Visit
Admission: RE | Admit: 2017-11-11 | Discharge: 2017-11-11 | Disposition: A | Discharge status: Home / Self Care | Payer: BLUE CROSS/BLUE SHIELD | Source: Ambulatory Visit | Attending: Family Medicine | Admitting: Family Medicine

## 2017-11-11 ENCOUNTER — Other Ambulatory Visit: Payer: Self-pay | Admitting: Family Medicine

## 2017-11-11 DIAGNOSIS — N6320 Unspecified lump in the left breast, unspecified quadrant: Secondary | ICD-10-CM

## 2017-11-11 DIAGNOSIS — N6310 Unspecified lump in the right breast, unspecified quadrant: Secondary | ICD-10-CM

## 2017-11-12 ENCOUNTER — Ambulatory Visit (INDEPENDENT_AMBULATORY_CARE_PROVIDER_SITE_OTHER): Payer: BLUE CROSS/BLUE SHIELD

## 2017-11-12 ENCOUNTER — Ambulatory Visit: Payer: BLUE CROSS/BLUE SHIELD | Admitting: Family Medicine

## 2017-11-12 ENCOUNTER — Encounter: Payer: Self-pay | Admitting: Family Medicine

## 2017-11-12 ENCOUNTER — Ambulatory Visit (INDEPENDENT_AMBULATORY_CARE_PROVIDER_SITE_OTHER): Payer: BLUE CROSS/BLUE SHIELD | Admitting: Family Medicine

## 2017-11-12 VITALS — BP 131/78 | HR 82 | Temp 97.2°F | Ht 67.0 in | Wt 256.0 lb

## 2017-11-12 DIAGNOSIS — I10 Essential (primary) hypertension: Secondary | ICD-10-CM

## 2017-11-12 DIAGNOSIS — N946 Dysmenorrhea, unspecified: Secondary | ICD-10-CM

## 2017-11-12 DIAGNOSIS — R1012 Left upper quadrant pain: Secondary | ICD-10-CM | POA: Diagnosis not present

## 2017-11-12 DIAGNOSIS — Z0000 Encounter for general adult medical examination without abnormal findings: Secondary | ICD-10-CM

## 2017-11-12 MED ORDER — FERROUS FUMARATE 325 (106 FE) MG PO TABS
1.0000 | ORAL_TABLET | Freq: Two times a day (BID) | ORAL | 5 refills | Status: DC
Start: 2017-11-12 — End: 2017-12-16

## 2017-11-12 MED ORDER — TRIAMTERENE-HCTZ 37.5-25 MG PO TABS: 1.0000 | ORAL_TABLET | Freq: Every day | ORAL | 5 refills | Status: DC

## 2017-11-12 NOTE — Progress Notes (Signed)
Subjective:  Patient ID: Bethany Richardson, female    DOB: 1971-01-24  Age: 47 y.o. MRN: [id number]  CC: Annual Exam   HPI Bethany Richardson presents for annual physical examination without genital exam.  She is having a large amount of intermenstrual bleeding with long periods and heavy flow.  Her flow is so heavy she bleeds through to nighttime pads when she is at work and has to leave work at times.  She has requested a gynecologic referral.  She has had an ablation before and it did no good at all.  She is wondering if she needs hysterectomy.  Additionally the patient was seen last week for a lump in her breast and underwent mammogram and ultrasound.  There is a suspicious area at the right upper outer quadrant and biopsy is planned for July 3. Depression screen [facility] 2/9 11/12/2017 10/28/2017 09/08/2016  Decreased Interest 0 0 0  Down, Depressed, Hopeless 0 0 0  PHQ - 2 Score 0 0 0    History Bethany Richardson has a past medical history of Adenomyosis, Chronic bronchitis ([place]), Depression, GERD (gastroesophageal reflux disease), Hypertension, Menorrhagia, Obesity, and Pneumonia (2013).   She has a past surgical history that includes Tubal ligation (Bilateral, 1995); Hysteroscopy with novasure (N/A, 10/19/2014); and vaginal ablation (June 2016).   Her family history includes Hypertension in her father and mother.She reports that she quit smoking about 8 years ago. Her smoking use included cigarettes. She has a 40.00 pack-year smoking history. She has never used smokeless tobacco. She reports that she drinks alcohol. She reports that she does not use drugs.    ROS Review of Systems  Constitutional: Negative.   HENT: Negative for congestion.   Eyes: Negative for visual disturbance.  Respiratory: Negative for shortness of breath.   Cardiovascular: Negative for chest pain.  Gastrointestinal: Positive for abdominal pain (Left upper quadrant near the rib margin.  It hurts for putting pressure on it.).  Negative for constipation, diarrhea, nausea and vomiting.  Genitourinary: Negative for difficulty urinating.  Musculoskeletal: Negative for arthralgias and myalgias.  Neurological: Negative for headaches.  Psychiatric/Behavioral: Negative for sleep disturbance.    Objective:  BP 131/78   Pulse 82   Temp (!) 97.2 F (36.2 C) (Oral)   Ht 5' 7"  (1.702 m)   Wt 256 lb (116.1 kg)   BMI 40.10 kg/m   BP Readings from Last 3 Encounters:  11/12/17 131/78  10/28/17 (!) 158/89  09/08/16 122/78    Wt Readings from Last 3 Encounters:  11/12/17 256 lb (116.1 kg)  10/28/17 259 lb (117.5 kg)  09/08/16 248 lb (112.5 kg)     Physical Exam  Constitutional: She is oriented to person, place, and time. No distress.  HENT:  Head: Normocephalic and atraumatic.  Right Ear: External ear normal.  Left Ear: External ear normal.  Nose: Nose normal.  Mouth/Throat: Oropharynx is clear and moist.  Eyes: Pupils are equal, round, and reactive to light. Conjunctivae and EOM are normal. No scleral icterus.  Neck: Normal range of motion. Neck supple. No JVD present. No tracheal deviation present. No thyromegaly present.  Cardiovascular: Normal rate, regular rhythm and normal heart sounds. Exam reveals no gallop and no friction rub.  No murmur heard. Pulmonary/Chest: Effort normal and breath sounds normal. No respiratory distress. She has no wheezes. She has no rales. She exhibits no tenderness.  Abdominal: Soft. Bowel sounds are normal. She exhibits no distension and no mass. There is tenderness (Left upper quadrant at the  rib margin midclavicular line.  Moderately tender without any masses.  Palpable just inferior to the rib). There is no rebound and no guarding.  Musculoskeletal: Normal range of motion. She exhibits no edema or tenderness.  Lymphadenopathy:    She has no cervical adenopathy.  Neurological: She is alert and oriented to person, place, and time. She displays normal reflexes. No cranial nerve  deficit. She exhibits normal muscle tone. Coordination normal.  Skin: Skin is warm and dry. No rash noted. She is not diaphoretic. No erythema.  Psychiatric: Judgment normal.  Vitals reviewed.     Assessment & Plan:   Bethany Richardson was seen today for annual exam.  Diagnoses and all orders for this visit:  Well adult exam -     CBC with Differential/Platelet -     CMP14+EGFR -     Lipid panel -     TSH -     VITAMIN D 25 Hydroxy (Vit-D Deficiency, Fractures) -     Urinalysis  Essential hypertension, benign -     CBC with Differential/Platelet -     CMP14+EGFR  Dysmenorrhea -     Ambulatory referral to Gynecology  LUQ pain -     DG Ribs Unilateral W/Chest Left; Future -     DG Abd 2 Views; Future  Other orders -     ferrous fumarate (HEMOCYTE - 106 MG FE) 325 (106 Fe) MG TABS tablet; Take 1 tablet (106 mg of iron total) by mouth 2 (two) times daily. -     triamterene-hydrochlorothiazide (MAXZIDE-25) 37.5-25 MG tablet; Take 1 tablet by mouth daily.       I am having Bethany Richardson start on ferrous fumarate. I am also having her maintain her triamterene-hydrochlorothiazide.  Allergies as of 11/12/2017      Reactions   Ace Inhibitors Cough      Medication List        Accurate as of 11/12/17  5:59 PM. Always use your most recent med list.          ferrous fumarate 325 (106 Fe) MG Tabs tablet Commonly known as:  HEMOCYTE - 106 mg FE Take 1 tablet (106 mg of iron total) by mouth 2 (two) times daily.   triamterene-hydrochlorothiazide 37.5-25 MG tablet Commonly known as:  MAXZIDE-25 Take 1 tablet by mouth daily.        Follow-up: Return in about 6 months (around 05/14/2018), or if symptoms worsen or fail to improve.  Claretta Fraise, M.D.

## 2017-11-13 LAB — CMP14+EGFR
A/G RATIO: 1.8 (ref 1.2–2.2)
ALBUMIN: 4.2 g/dL (ref 3.5–5.5)
ALK PHOS: 72 IU/L (ref 39–117)
ALT: 30 IU/L (ref 0–32)
AST: 16 IU/L (ref 0–40)
BILIRUBIN TOTAL: 0.5 mg/dL (ref 0.0–1.2)
BUN / CREAT RATIO: 12 (ref 9–23)
BUN: 11 mg/dL (ref 6–24)
CHLORIDE: 102 mmol/L (ref 96–106)
CO2: 26 mmol/L (ref 20–29)
Calcium: 9.1 mg/dL (ref 8.7–10.2)
Creatinine, Ser: 0.89 mg/dL (ref 0.57–1.00)
GFR calc Af Amer: 91 mL/min/{1.73_m2} (ref >59)
GFR calc non Af Amer: 79 mL/min/{1.73_m2} (ref >59)
GLUCOSE: 95 mg/dL (ref 65–99)
Globulin, Total: 2.4 g/dL (ref 1.5–4.5)
POTASSIUM: 4.1 mmol/L (ref 3.5–5.2)
Sodium: 141 mmol/L (ref 134–144)
Total Protein: 6.6 g/dL (ref 6.0–8.5)

## 2017-11-13 LAB — CBC WITH DIFFERENTIAL/PLATELET
BASOS ABS: 0.1 10*3/uL (ref 0.0–0.2)
Basos: 1 %
EOS (ABSOLUTE): 0.3 10*3/uL (ref 0.0–0.4)
Eos: 3 %
HEMOGLOBIN: 13.7 g/dL (ref 11.1–15.9)
Hematocrit: 41.8 % (ref 34.0–46.6)
Immature Grans (Abs): 0 10*3/uL (ref 0.0–0.1)
Immature Granulocytes: 0 %
LYMPHS ABS: 2.3 10*3/uL (ref 0.7–3.1)
Lymphs: 29 %
MCH: 24.8 pg — AB (ref 26.6–33.0)
MCHC: 32.8 g/dL (ref 31.5–35.7)
MCV: 76 fL — AB (ref 79–97)
MONOCYTES: 7 %
Monocytes Absolute: 0.5 10*3/uL (ref 0.1–0.9)
NEUTROS ABS: 4.6 10*3/uL (ref 1.4–7.0)
Neutrophils: 60 %
Platelets: 240 10*3/uL (ref 150–450)
RBC: 5.52 x10E6/uL — ABNORMAL HIGH (ref 3.77–5.28)
RDW: 15.1 % (ref 12.3–15.4)
WBC: 7.8 10*3/uL (ref 3.4–10.8)

## 2017-11-13 LAB — LIPID PANEL
CHOL/HDL RATIO: 3.7 ratio (ref 0.0–4.4)
Cholesterol, Total: 173 mg/dL (ref 100–199)
HDL: 47 mg/dL (ref >39)
LDL Calculated: 100 mg/dL — ABNORMAL HIGH (ref 0–99)
Triglycerides: 132 mg/dL (ref 0–149)
VLDL CHOLESTEROL CAL: 26 mg/dL (ref 5–40)

## 2017-11-13 LAB — VITAMIN D 25 HYDROXY (VIT D DEFICIENCY, FRACTURES): Vit D, 25-Hydroxy: 28.3 ng/mL — ABNORMAL LOW (ref 30.0–100.0)

## 2017-11-13 LAB — TSH: TSH: 1.24 u[IU]/mL (ref 0.450–4.500)

## 2017-11-15 ENCOUNTER — Other Ambulatory Visit: Payer: Self-pay | Admitting: *Deleted

## 2017-11-15 DIAGNOSIS — R87610 Atypical squamous cells of undetermined significance on cytologic smear of cervix (ASC-US): Secondary | ICD-10-CM

## 2017-11-15 HISTORY — DX: Atypical squamous cells of undetermined significance on cytologic smear of cervix (ASC-US): R87.610

## 2017-11-15 MED ORDER — VITAMIN D (ERGOCALCIFEROL) 1.25 MG (50000 UNIT) PO CAPS: 50000.0000 [IU] | ORAL_CAPSULE | ORAL | 1 refills | Status: DC

## 2017-11-17 ENCOUNTER — Ambulatory Visit
Admission: RE | Admit: 2017-11-17 | Discharge: 2017-11-17 | Disposition: A | Discharge status: Home / Self Care | Payer: BLUE CROSS/BLUE SHIELD | Source: Ambulatory Visit | Attending: Family Medicine | Admitting: Family Medicine

## 2017-11-17 ENCOUNTER — Other Ambulatory Visit: Payer: Self-pay | Admitting: Family Medicine

## 2017-11-17 DIAGNOSIS — N6310 Unspecified lump in the right breast, unspecified quadrant: Secondary | ICD-10-CM

## 2017-11-23 HISTORY — PX: BREAST BIOPSY: SHX20

## 2017-12-02 ENCOUNTER — Encounter: Payer: Self-pay | Admitting: Gynecology

## 2017-12-02 ENCOUNTER — Ambulatory Visit (INDEPENDENT_AMBULATORY_CARE_PROVIDER_SITE_OTHER): Payer: BLUE CROSS/BLUE SHIELD | Admitting: Gynecology

## 2017-12-02 VITALS — BP 138/84 | Ht 66.5 in | Wt 255.0 lb

## 2017-12-02 DIAGNOSIS — N921 Excessive and frequent menstruation with irregular cycle: Secondary | ICD-10-CM

## 2017-12-02 DIAGNOSIS — Z124 Encounter for screening for malignant neoplasm of cervix: Secondary | ICD-10-CM | POA: Diagnosis not present

## 2017-12-02 NOTE — Progress Notes (Signed)
Bethany Richardson 1971-04-16 [id number]        47 y.o.  G2P2 new patient presents with a history of heavy irregular bleeding.  Has always had issues with heavy bleeding.  Underwent endometrial ablation 2016.  Has been on birth control pills as well as progesterone only treatments without success as far as controlling her heavy menses for which she wears double protection and has bleedthrough episodes.  Now experiencing bleeding throughout the month on and off.  Status post postpartum BTL in the past.  SVD x2.  Recent hemoglobin 13.  Taking daily iron. Past medical history,surgical history, problem list, medications, allergies, family history and social history were all reviewed and documented as reviewed in the EPIC chart.  ROS:  Performed with pertinent positives and negatives included in the history, assessment and plan.   Additional significant findings : None   Exam: Caryn Bee assistant Vitals:   12/02/17 1515  BP: 138/84  Weight: 255 lb (115.7 kg)  Height: 5' 6.5" (1.689 m)   Body mass index is 40.54 kg/m.  General appearance:  Normal affect, orientation and appearance. Skin: Grossly normal HEENT: Without gross lesions.  No cervical or supraclavicular adenopathy. Thyroid normal.  Lungs:  Clear without wheezing, rales or rhonchi Cardiac: RR, without RMG Abdominal:  Soft, nontender, without masses, guarding, rebound, organomegaly or hernia.  Moderate panniculus noted. Breasts:  Examined lying and sitting without masses, retractions, discharge or axillary adenopathy. Pelvic:  Ext, BUS, Vagina: Normal  Cervix: Normal  Uterus: Difficult to palpate but no gross masses or tenderness  Adnexa: Without gross masses or tenderness    Anus and perineum: Normal   Rectovaginal: Normal sphincter tone without palpated masses or tenderness.    Assessment/Plan:  47 y.o. G2P2 female for annual gynecologic exam with heavy irregular periods, tubal sterilization.   1. Heavy irregular menses  with intermenstrual bleeding despite endometrial ablation, birth control pills and progesterone only treatments.  Exam limited by abdominal girth.  Recommend starting with ultrasound/sonohysterogram for pelvic surveillance and endometrial assessment.  Options for management reviewed to include continued attempts at hormonal manipulation, trial of Mirena IUD if uterine cavity appears clear versus definitive treatment with hysterectomy.  Approaches with hysterectomy to include attempted [facility], LAVH, robotic and TAH discussed.  Her uterus is well supported with cervix high in the vaginal vault.  Panniculus also presenting issues.  Given the total picture I think the best approach would be robotic to avoid larger abdominal incision with TAH and possible wound complications.  We also reviewed the issues of ovarian conservation if she does proceed with hysterectomy.  Options to keep both ovaries or remove both ovaries and consider hormone replacement afterwards discussed.  She relates that her maternal grandmother had ovarian cancer although she is alive in her 77s and there is no other history of ovarian cancer or breast cancer in the family.  I asked her to check with her mother about this history to absolutely confirm this.  She understands if she keeps her ovaries she will be at risk for ovarian cancer or ovarian complications in the future.  If she removes her ovaries then the issues of hormone deficiency and possible HRT with the issues associated with HRT also discussed.  We will rediscuss all of these issues after the ultrasound. 2. Mammography 10/2017.  Breast exam normal today. 3. Pap smear 2014.  Pap smear done today.  No history of abnormal Pap smears previously.  Health maintenance.  No routine lab work done as  patient recently had this done at her primary physician's office.  Follow-up for sonohysterogram and further discussion of treatment options.  Follow-up in 1 year for annual exam.   Anastasio Auerbach MD, 4:00 PM 12/02/2017

## 2017-12-02 NOTE — Addendum Note (Signed)
Addended by: Nelva Nay on: 12/02/2017 04:13 PM   Modules accepted: Orders

## 2017-12-02 NOTE — Patient Instructions (Signed)
Follow-up for ultrasound as scheduled.

## 2017-12-04 LAB — PAP IG W/ RFLX HPV ASCU

## 2017-12-04 LAB — HUMAN PAPILLOMAVIRUS, HIGH RISK: HPV DNA HIGH RISK: NOT DETECTED

## 2017-12-06 ENCOUNTER — Other Ambulatory Visit: Payer: Self-pay | Admitting: Gynecology

## 2017-12-06 ENCOUNTER — Encounter: Payer: Self-pay | Admitting: Gynecology

## 2017-12-06 DIAGNOSIS — N939 Abnormal uterine and vaginal bleeding, unspecified: Secondary | ICD-10-CM

## 2017-12-07 NOTE — Telephone Encounter (Signed)
Spoke with patient on phone and she has been informed of pap results.

## 2017-12-16 ENCOUNTER — Ambulatory Visit (INDEPENDENT_AMBULATORY_CARE_PROVIDER_SITE_OTHER): Payer: BLUE CROSS/BLUE SHIELD | Admitting: Gynecology

## 2017-12-16 ENCOUNTER — Ambulatory Visit: Payer: BLUE CROSS/BLUE SHIELD

## 2017-12-16 ENCOUNTER — Encounter: Payer: Self-pay | Admitting: Gynecology

## 2017-12-16 VITALS — BP 134/80

## 2017-12-16 DIAGNOSIS — N939 Abnormal uterine and vaginal bleeding, unspecified: Secondary | ICD-10-CM

## 2017-12-16 DIAGNOSIS — N921 Excessive and frequent menstruation with irregular cycle: Secondary | ICD-10-CM | POA: Diagnosis not present

## 2017-12-16 DIAGNOSIS — N83299 Other ovarian cyst, unspecified side: Secondary | ICD-10-CM

## 2017-12-16 NOTE — Patient Instructions (Addendum)
Office will call you with biopsy results.  Office will call you to arrange surgery.

## 2017-12-16 NOTE — Progress Notes (Signed)
    EVANNE MATSUNAGA [birth year] [id number]        47 y.o.  G2P2 presents for sonohysterogram.  History of heavy irregular periods with cramping and discomfort.  Underwent endometrial ablation 2016.  Has been on birth control pills as well as progesterone only without success as far as controlling her bleeding.  Currently wearing double protection with bleedthrough episodes.  Status post BTL in the past.  SVD x2.  Recent hemoglobin 13.  On daily iron  Past medical history,surgical history, problem list, medications, allergies, family history and social history were all reviewed and documented in the EPIC chart.  Directed ROS with pertinent positives and negatives documented in the history of present illness/assessment and plan.  Exam: [place] assistant Vitals:   12/16/17 1202  BP: 134/80   General appearance:  Normal Abdomen soft nontender without masses guarding rebound Pelvic external BUS vagina normal.  Cervix normal.  Uterus difficult to palpate but grossly normal midline mobile nontender.  Adnexa without masses or tenderness.  Ultrasound transvaginal and transabdominal shows uterus grossly normal in size with normal echotexture.  Endometrial echo 14.2 mm.  Right ovary normal.  Left ovary noted to have 4 cysts #1 38 x 38 mm with calcification anterior wall 12 x 5 mm,  #2 33 x 19 mm,  #3 17 x 17 mm,  #4 22 x 19 mm.  All echo-free.  All without color flow Doppler.  Cul-de-sac negative  Sonohysterogram performed, sterile technique, easy catheter introduction, good distention with no abnormality seen.  Endometrial biopsy taken.  Patient tolerated well  Assessment/Plan:  47 y.o. G2P2 with history as above.  Having unacceptable bleeding and cramping with pain.  Left ovary with cystic changes benign in appearance.  Options for management reviewed with the patient to include continued attempts at hormonal manipulation, trial of Mirena IUD, hysterectomy.  Patient strongly wants to proceed with  hysterectomy.  Given the anatomy I think a robotic approach is most appropriate as uterus is high in the pelvis making a vaginal approach much more challenging.  The ovarian conservation issue was reviewed with the patient.  Previously she was unsure about her grandmother's history of ovarian cancer but it turned out that this was uterine cancer and not ovarian cancer.  We again discussed the benefits of keeping her ovaries for ongoing hormonal production but excepting the risks of ovarian disease such as cysts, pain and ovarian cancer in the future versus removing both ovaries and the risks of estrogen deprivation or estrogen replacement with the risks of estrogen replacement were all discussed with her.  At this point the patient wants both ovaries also removed so that she has no chance for issues with her ovaries and reoperation in the future.  She understands that my partner Dr Dellis Filbert is an expert in this area and that she would be the primary surgeon and will go ahead and schedule her surgery and she will have a preop appointment with Dr Dellis Filbert before hand.    Anastasio Auerbach MD, 3:41 PM 12/16/2017

## 2017-12-20 ENCOUNTER — Telehealth: Payer: Self-pay

## 2017-12-20 NOTE — Telephone Encounter (Signed)
I called patient to discuss scheduling Robotic Hysterectomy with Dr. Marguerita Merles.  I reviewed her insurance benefits and her estimated surgery prepymt amount due by one week prior to surgery.  Patient was not ready to schedule stating she needs some time to plan her finances.

## 2017-12-21 ENCOUNTER — Encounter: Payer: Self-pay | Admitting: Anesthesiology

## 2018-01-12 NOTE — Patient Instructions (Signed)
Bethany Richardson  01/12/2018   Your procedure is scheduled on: 01/19/2018   Report to [facility] Main  Entrance  Report to admitting at   [place] AM    Call this number if you have problems the morning of surgery [phone]   Remember: Do not eat food or drink liquids :After Midnight.     Take these medicines the morning of surgery with A SIP OF WATER: none                                 You may not have any metal on your body including hair pins and              piercings  Do not wear jewelry, make-up, lotions, powders or perfumes, deodorant             Do not wear nail polish.  Do not shave  48 hours prior to surgery.                Do not bring valuables to the hospital. [place].  Contacts, dentures or bridgework may not be worn into surgery.  Leave suitcase in the car. After surgery it may be brought to your room.         Special Instructions: coughing and deep breathing exercises, leg exercises               Please read over the following fact sheets you were given: _____________________________________________________________________             [facility] - Preparing for Surgery Before surgery, you can play an important role.  Because skin is not sterile, your skin needs to be as free of germs as possible.  You can reduce the number of germs on your skin by washing with CHG (chlorahexidine gluconate) soap before surgery.  CHG is an antiseptic cleaner which kills germs and bonds with the skin to continue killing germs even after washing. Please DO NOT use if you have an allergy to CHG or antibacterial soaps.  If your skin becomes reddened/irritated stop using the CHG and inform your nurse when you arrive at Short Stay. Do not shave (including legs and underarms) for at least 48 hours prior to the first CHG shower.  You may shave your face/neck. Please follow these instructions carefully:  1.   Shower with CHG Soap the night before surgery and the  morning of Surgery.  2.  If you choose to wash your hair, wash your hair first as usual with your  normal  shampoo.  3.  After you shampoo, rinse your hair and body thoroughly to remove the  shampoo.                           4.  Use CHG as you would any other liquid soap.  You can apply chg directly  to the skin and wash                       Gently with a scrungie or clean washcloth.  5.  Apply the CHG Soap to your body ONLY FROM THE NECK DOWN.   Do not use  on face/ open                           Wound or open sores. Avoid contact with eyes, ears mouth and genitals (private parts).                       Wash face,  Genitals (private parts) with your normal soap.             6.  Wash thoroughly, paying special attention to the area where your surgery  will be performed.  7.  Thoroughly rinse your body with warm water from the neck down.  8.  DO NOT shower/wash with your normal soap after using and rinsing off  the CHG Soap.                9.  Pat yourself dry with a clean towel.            10.  Wear clean pajamas.            11.  Place clean sheets on your bed the night of your first shower and do not  sleep with pets. Day of Surgery : Do not apply any lotions/deodorants the morning of surgery.  Please wear clean clothes to the hospital/surgery center.  FAILURE TO FOLLOW THESE INSTRUCTIONS MAY RESULT IN THE CANCELLATION OF YOUR SURGERY PATIENT SIGNATURE_________________________________  NURSE SIGNATURE__________________________________  ________________________________________________________________________  WHAT IS A BLOOD TRANSFUSION? Blood Transfusion Information  A transfusion is the replacement of blood or some of its parts. Blood is made up of multiple cells which provide different functions.  Red blood cells carry oxygen and are used for blood loss replacement.  White blood cells fight against infection.  Platelets control  bleeding.  Plasma helps clot blood.  Other blood products are available for specialized needs, such as hemophilia or other clotting disorders. BEFORE THE TRANSFUSION  Who gives blood for transfusions?   Healthy volunteers who are fully evaluated to make sure their blood is safe. This is blood bank blood. Transfusion therapy is the safest it has ever been in the practice of medicine. Before blood is taken from a donor, a complete history is taken to make sure that person has no history of diseases nor engages in risky social behavior (examples are intravenous drug use or sexual activity with multiple partners). The donor's travel history is screened to minimize risk of transmitting infections, such as malaria. The donated blood is tested for signs of infectious diseases, such as HIV and hepatitis. The blood is then tested to be sure it is compatible with you in order to minimize the chance of a transfusion reaction. If you or a relative donates blood, this is often done in anticipation of surgery and is not appropriate for emergency situations. It takes many days to process the donated blood. RISKS AND COMPLICATIONS Although transfusion therapy is very safe and saves many lives, the main dangers of transfusion include:   Getting an infectious disease.  Developing a transfusion reaction. This is an allergic reaction to something in the blood you were given. Every precaution is taken to prevent this. The decision to have a blood transfusion has been considered carefully by your caregiver before blood is given. Blood is not given unless the benefits outweigh the risks. AFTER THE TRANSFUSION  Right after receiving a blood transfusion, you will usually feel much better and more energetic. This is especially true if your  red blood cells have gotten low (anemic). The transfusion raises the level of the red blood cells which carry oxygen, and this usually causes an energy increase.  The nurse  administering the transfusion will monitor you carefully for complications. HOME CARE INSTRUCTIONS  No special instructions are needed after a transfusion. You may find your energy is better. Speak with your caregiver about any limitations on activity for underlying diseases you may have. SEEK MEDICAL CARE IF:   Your condition is not improving after your transfusion.  You develop redness or irritation at the intravenous (IV) site. SEEK IMMEDIATE MEDICAL CARE IF:  Any of the following symptoms occur over the next 12 hours:  Shaking chills.  You have a temperature by mouth above 102 F (38.9 C), not controlled by medicine.  Chest, back, or muscle pain.  People around you feel you are not acting correctly or are confused.  Shortness of breath or difficulty breathing.  Dizziness and fainting.  You get a rash or develop hives.  You have a decrease in urine output.  Your urine turns a dark color or changes to pink, red, or brown. Any of the following symptoms occur over the next 10 days:  You have a temperature by mouth above 102 F (38.9 C), not controlled by medicine.  Shortness of breath.  Weakness after normal activity.  The white part of the eye turns yellow (jaundice).  You have a decrease in the amount of urine or are urinating less often.  Your urine turns a dark color or changes to pink, red, or brown. Document Released: 05/01/2000 Document Revised: 07/27/2011 Document Reviewed: 12/19/2007 ExitCare Patient Information 2014 [place].  _______________________________________________________________________  Incentive Spirometer  An incentive spirometer is a tool that can help keep your lungs clear and active. This tool measures how well you are filling your lungs with each breath. Taking long deep breaths may help reverse or decrease the chance of developing breathing (pulmonary) problems (especially infection) following:  A long period of time when you are  unable to move or be active. BEFORE THE PROCEDURE   If the spirometer includes an indicator to show your best effort, your nurse or respiratory therapist will set it to a desired goal.  If possible, sit up straight or lean slightly forward. Try not to slouch.  Hold the incentive spirometer in an upright position. INSTRUCTIONS FOR USE  1. Sit on the edge of your bed if possible, or sit up as far as you can in bed or on a chair. 2. Hold the incentive spirometer in an upright position. 3. Breathe out normally. 4. Place the mouthpiece in your mouth and seal your lips tightly around it. 5. Breathe in slowly and as deeply as possible, raising the piston or the ball toward the top of the column. 6. Hold your breath for 3-5 seconds or for as long as possible. Allow the piston or ball to fall to the bottom of the column. 7. Remove the mouthpiece from your mouth and breathe out normally. 8. Rest for a few seconds and repeat Steps 1 through 7 at least 10 times every 1-2 hours when you are awake. Take your time and take a few normal breaths between deep breaths. 9. The spirometer may include an indicator to show your best effort. Use the indicator as a goal to work toward during each repetition. 10. After each set of 10 deep breaths, practice coughing to be sure your lungs are clear. If you have an incision (the  cut made at the time of surgery), support your incision when coughing by placing a pillow or rolled up towels firmly against it. Once you are able to get out of bed, walk around indoors and cough well. You may stop using the incentive spirometer when instructed by your caregiver.  RISKS AND COMPLICATIONS  Take your time so you do not get dizzy or light-headed.  If you are in pain, you may need to take or ask for pain medication before doing incentive spirometry. It is harder to take a deep breath if you are having pain. AFTER USE  Rest and breathe slowly and easily.  It can be helpful to  keep track of a log of your progress. Your caregiver can provide you with a simple table to help with this. If you are using the spirometer at home, follow these instructions: [place] IF:   You are having difficultly using the spirometer.  You have trouble using the spirometer as often as instructed.  Your pain medication is not giving enough relief while using the spirometer.  You develop fever of 100.5 F (38.1 C) or higher. SEEK IMMEDIATE MEDICAL CARE IF:   You cough up bloody sputum that had not been present before.  You develop fever of 102 F (38.9 C) or greater.  You develop worsening pain at or near the incision site. MAKE SURE YOU:   Understand these instructions.  Will watch your condition.  Will get help right away if you are not doing well or get worse. Document Released: 09/14/2006 Document Revised: 07/27/2011 Document Reviewed: 11/15/2006 [facility] Patient Information 2014 [place], [place].   ________________________________________________________________________

## 2018-01-13 ENCOUNTER — Encounter (HOSPITAL_COMMUNITY)
Admission: RE | Admit: 2018-01-13 | Discharge: 2018-01-13 | Disposition: A | Discharge status: Home / Self Care | Payer: BLUE CROSS/BLUE SHIELD | Source: Ambulatory Visit | Attending: Obstetrics & Gynecology | Admitting: Obstetrics & Gynecology

## 2018-01-13 ENCOUNTER — Encounter (HOSPITAL_COMMUNITY): Payer: Self-pay

## 2018-01-13 ENCOUNTER — Ambulatory Visit (INDEPENDENT_AMBULATORY_CARE_PROVIDER_SITE_OTHER): Payer: BLUE CROSS/BLUE SHIELD | Admitting: Obstetrics & Gynecology

## 2018-01-13 ENCOUNTER — Encounter: Payer: Self-pay | Admitting: Obstetrics & Gynecology

## 2018-01-13 ENCOUNTER — Other Ambulatory Visit: Payer: Self-pay

## 2018-01-13 VITALS — BP 130/90

## 2018-01-13 DIAGNOSIS — Z01812 Encounter for preprocedural laboratory examination: Secondary | ICD-10-CM | POA: Diagnosis present

## 2018-01-13 DIAGNOSIS — R102 Pelvic and perineal pain: Secondary | ICD-10-CM

## 2018-01-13 DIAGNOSIS — Z01810 Encounter for preprocedural cardiovascular examination: Secondary | ICD-10-CM | POA: Insufficient documentation

## 2018-01-13 DIAGNOSIS — N921 Excessive and frequent menstruation with irregular cycle: Secondary | ICD-10-CM | POA: Diagnosis not present

## 2018-01-13 DIAGNOSIS — N83202 Unspecified ovarian cyst, left side: Secondary | ICD-10-CM

## 2018-01-13 DIAGNOSIS — R9431 Abnormal electrocardiogram [ECG] [EKG]: Secondary | ICD-10-CM | POA: Insufficient documentation

## 2018-01-13 LAB — CBC
HCT: 42.4 % (ref 36.0–46.0)
HEMOGLOBIN: 14.1 g/dL (ref 12.0–15.0)
MCH: 26.4 pg (ref 26.0–34.0)
MCHC: 33.3 g/dL (ref 30.0–36.0)
MCV: 79.4 fL (ref 78.0–100.0)
Platelets: 201 10*3/uL (ref 150–400)
RBC: 5.34 MIL/uL — AB (ref 3.87–5.11)
RDW: 14.9 % (ref 11.5–15.5)
WBC: 6.2 10*3/uL (ref 4.0–10.5)

## 2018-01-13 LAB — BASIC METABOLIC PANEL
Anion gap: 9 (ref 5–15)
BUN: 14 mg/dL (ref 6–20)
CALCIUM: 8.4 mg/dL — AB (ref 8.9–10.3)
CO2: 27 mmol/L (ref 22–32)
Chloride: 103 mmol/L (ref 98–111)
Creatinine, Ser: 0.75 mg/dL (ref 0.44–1.00)
GFR calc Af Amer: >60 mL/min (ref >60)
GLUCOSE: 193 mg/dL — AB (ref 70–99)
Potassium: 3.2 mmol/L — ABNORMAL LOW (ref 3.5–5.1)
Sodium: 139 mmol/L (ref 135–145)

## 2018-01-13 LAB — ABO/RH: ABO/RH(D): O POS

## 2018-01-13 LAB — PREGNANCY, URINE: PREG TEST UR: NEGATIVE

## 2018-01-13 NOTE — Progress Notes (Signed)
    Bethany Richardson 1970-07-16 [id number]        47 y.o.  G2P2L2 Married.  Husband present at the visit.  Patient referred to me by Dr. Phineas Real  RP: Preop for XI Robotic TLH/BSO  HPI: Severe vasomotor menopausal symptoms.  Left ovarian cysts.  Persistent pelvic pain.  Irregular bleeding and Postcoital bleeding.   OB History  Gravida Para Term Preterm AB Living  2 2       2   SAB TAB Ectopic Multiple Live Births               # Outcome Date GA Lbr Len/2nd Weight Sex Delivery Anes PTL Lv  2 Para           1 Para             Past medical history,surgical history, problem list, medications, allergies, family history and social history were all reviewed and documented in the EPIC chart.   Directed ROS with pertinent positives and negatives documented in the history of present illness/assessment and plan.  Exam:  Vitals:   01/13/18 0804  BP: 130/90   General appearance:  Normal  12/16/2017 Pelvic US/Sonohysterogram:  Ultrasound transvaginal and transabdominal shows uterus grossly normal in size with normal echotexture. Endometrial echo 14.2 mm. Right ovary normal. Left ovary noted to have 4 cysts #138 x 38 mm with calcification anterior wall 12 x 5 mm, #2 33 x 19 mm, #3 17 x 17 mm, #4 22 x 19 mm. All echo-free. All without color flow Doppler. Cul-de-sac negative  Sonohysterogram performed, sterile technique, easy catheter introduction, good distention with no abnormality seen. Endometrial biopsy taken. Patient tolerated well  EBx 12/16/2017: Endometrium, biopsy, uterus - BENIGN EARLY SECRETORY ENDOMETRIUM. - NO HYPERPLASIA OR MALIGNANCY IDENTIFIED.  Assessment/Plan:  47 y.o. G2P2   1. Menometrorrhagia Persistence menometrorrhagia with pelvic pain and left ovarian cysts in perimenopause with severe vasomotor symptoms.  Endometrial biopsy benign.  Patient of Dr. Phineas Real initially who sent her to me to perform her hysterectomy using the [place] robot given that the  uterus is not descending at all vaginally, which would have made an LAVH more difficult. Decision to proceed with XI Robotic total laparoscopic hysterectomy with bilateral salpingo-oophorectomy.  Surgery and risks reviewed thoroughly with patient.  Including the risk of trauma, hemorrhage, blood transfusion, infection, DVT/PE, anesthesiology complication.  The preop preparation as well as the postop management and expectations reviewed with patient.  Patient voiced understanding and agreement with the plan.  2. Female pelvic pain As above  3. Left ovarian cyst As above                        Patient was counseled as to the risk of surgery to include the following:  1. Infection (prohylactic antibiotics will be administered)  2. DVT/Pulmonary Embolism (prophylactic pneumo compression stockings will be used)  3.Trauma to internal organs requiring additional surgical procedure to repair any injury to internal organs requiring perhaps additional hospitalization days.  4.Hemmorhage requiring transfusion and blood products which carry risks such as anaphylactic reaction, hepatitis and AIDS  Patient had received literature information on the procedure scheduled and all her questions were answered and fully accepts all risk.  Counseling on above issues and coordination of care more than 50% for 15 minutes.  Princess Bruins MD, 8:16 AM 01/13/2018

## 2018-01-13 NOTE — Progress Notes (Signed)
[facility]p dopne 01/13/2018 faxed via epic to Dr Dellis Filbert.

## 2018-01-14 NOTE — Progress Notes (Signed)
Final EKG done 01/13/18-epic.

## 2018-01-15 ENCOUNTER — Encounter: Payer: Self-pay | Admitting: Obstetrics & Gynecology

## 2018-01-15 NOTE — Patient Instructions (Signed)
1. Menometrorrhagia Persistence menometrorrhagia with pelvic pain and left ovarian cysts in perimenopause with severe vasomotor symptoms.  Endometrial biopsy benign.  Patient of Dr. Phineas Real initially who sent her to me to perform her hysterectomy using the [place] robot given that the uterus is not descending at all vaginally, which would have made an LAVH more difficult. Decision to proceed with XI Robotic total laparoscopic hysterectomy with bilateral salpingo-oophorectomy.  Surgery and risks reviewed thoroughly with patient.  Including the risk of trauma, hemorrhage, blood transfusion, infection, DVT/PE, anesthesiology complication.  The preop preparation as well as the postop management and expectations reviewed with patient.  Patient voiced understanding and agreement with the plan.  2. Female pelvic pain As above  3. Left ovarian cyst As above                        Patient was counseled as to the risk of surgery to include the following:  1. Infection (prohylactic antibiotics will be administered)  2. DVT/Pulmonary Embolism (prophylactic pneumo compression stockings will be used)  3.Trauma to internal organs requiring additional surgical procedure to repair any injury to internal organs requiring perhaps additional hospitalization days.  4.Hemmorhage requiring transfusion and blood products which carry risks such as anaphylactic reaction, hepatitis and AIDS  Patient had received literature information on the procedure scheduled and all her questions were answered and fully accepts all risk.  Bethany Richardson, it was a pleasure meeting you today!

## 2018-01-18 ENCOUNTER — Encounter (HOSPITAL_COMMUNITY): Payer: Self-pay | Admitting: Anesthesiology

## 2018-01-18 NOTE — Anesthesia Preprocedure Evaluation (Addendum)
Anesthesia Evaluation  Patient identified by MRN, date of birth, ID band Patient awake    Reviewed: Allergy & Precautions, NPO status , Patient's Chart, lab work & pertinent test results  Airway Mallampati: I       Dental no notable dental hx. (+) Teeth Intact   Pulmonary former smoker,    Pulmonary exam normal breath sounds clear to auscultation       Cardiovascular hypertension, Pt. on medications Normal cardiovascular exam Rhythm:Regular Rate:Normal     Neuro/Psych PSYCHIATRIC DISORDERS Depression    GI/Hepatic   Endo/Other  diabetes, Type 2Morbid obesity  Renal/GU   negative genitourinary   Musculoskeletal negative musculoskeletal ROS (+)   Abdominal (+) + obese,   Peds  Hematology   Anesthesia Other Findings   Reproductive/Obstetrics                            Anesthesia Physical Anesthesia Plan  ASA: III  Anesthesia Plan: General   Post-op Pain Management:    Induction: Intravenous  PONV Risk Score and Plan: 4 or greater and Ondansetron, Dexamethasone and Scopolamine patch - Pre-op  Airway Management Planned: Oral ETT  Additional Equipment:   Intra-op Plan:   Post-operative Plan: Extubation in OR  Informed Consent: I have reviewed the patients History and Physical, chart, labs and discussed the procedure including the risks, benefits and alternatives for the proposed anesthesia with the patient or authorized representative who has indicated his/her understanding and acceptance.   Dental advisory given  Plan Discussed with: CRNA and Surgeon  Anesthesia Plan Comments:        Anesthesia Quick Evaluation

## 2018-01-19 ENCOUNTER — Ambulatory Visit (HOSPITAL_COMMUNITY): Payer: BLUE CROSS/BLUE SHIELD | Admitting: Anesthesiology

## 2018-01-19 ENCOUNTER — Encounter (HOSPITAL_COMMUNITY)
Admission: RE | Disposition: A | Discharge status: Home / Self Care | Payer: Self-pay | Source: Ambulatory Visit | Attending: Obstetrics & Gynecology

## 2018-01-19 ENCOUNTER — Ambulatory Visit (HOSPITAL_COMMUNITY)
Admission: RE | Admit: 2018-01-19 | Discharge: 2018-01-20 | Disposition: A | Discharge status: Home / Self Care | Payer: BLUE CROSS/BLUE SHIELD | Source: Ambulatory Visit | Attending: Obstetrics & Gynecology | Admitting: Obstetrics & Gynecology

## 2018-01-19 ENCOUNTER — Encounter (HOSPITAL_COMMUNITY): Payer: Self-pay | Admitting: Anesthesiology

## 2018-01-19 DIAGNOSIS — Z888 Allergy status to other drugs, medicaments and biological substances status: Secondary | ICD-10-CM | POA: Insufficient documentation

## 2018-01-19 DIAGNOSIS — Z79899 Other long term (current) drug therapy: Secondary | ICD-10-CM | POA: Diagnosis not present

## 2018-01-19 DIAGNOSIS — N83201 Unspecified ovarian cyst, right side: Secondary | ICD-10-CM | POA: Insufficient documentation

## 2018-01-19 DIAGNOSIS — Z6841 Body Mass Index (BMI) 40.0 and over, adult: Secondary | ICD-10-CM | POA: Diagnosis not present

## 2018-01-19 DIAGNOSIS — I10 Essential (primary) hypertension: Secondary | ICD-10-CM | POA: Insufficient documentation

## 2018-01-19 DIAGNOSIS — E119 Type 2 diabetes mellitus without complications: Secondary | ICD-10-CM | POA: Diagnosis not present

## 2018-01-19 DIAGNOSIS — F329 Major depressive disorder, single episode, unspecified: Secondary | ICD-10-CM | POA: Diagnosis not present

## 2018-01-19 DIAGNOSIS — Z87891 Personal history of nicotine dependence: Secondary | ICD-10-CM | POA: Insufficient documentation

## 2018-01-19 DIAGNOSIS — N8301 Follicular cyst of right ovary: Secondary | ICD-10-CM

## 2018-01-19 DIAGNOSIS — N8302 Follicular cyst of left ovary: Secondary | ICD-10-CM

## 2018-01-19 DIAGNOSIS — N83202 Unspecified ovarian cyst, left side: Secondary | ICD-10-CM | POA: Insufficient documentation

## 2018-01-19 DIAGNOSIS — K219 Gastro-esophageal reflux disease without esophagitis: Secondary | ICD-10-CM | POA: Insufficient documentation

## 2018-01-19 DIAGNOSIS — R102 Pelvic and perineal pain: Secondary | ICD-10-CM | POA: Insufficient documentation

## 2018-01-19 DIAGNOSIS — Z9889 Other specified postprocedural states: Secondary | ICD-10-CM

## 2018-01-19 DIAGNOSIS — N921 Excessive and frequent menstruation with irregular cycle: Secondary | ICD-10-CM | POA: Insufficient documentation

## 2018-01-19 HISTORY — PX: ROBOTIC ASSISTED TOTAL HYSTERECTOMY WITH BILATERAL SALPINGO OOPHERECTOMY: SHX6086

## 2018-01-19 LAB — TYPE AND SCREEN
ABO/RH(D): O POS
Antibody Screen: NEGATIVE

## 2018-01-19 SURGERY — HYSTERECTOMY, TOTAL, ROBOT-ASSISTED, LAPAROSCOPIC, WITH BILATERAL SALPINGO-OOPHORECTOMY
Anesthesia: General | Laterality: Bilateral

## 2018-01-19 MED ORDER — OXYCODONE-ACETAMINOPHEN 5-325 MG PO TABS: 2.0000 | ORAL_TABLET | ORAL | Status: DC | PRN

## 2018-01-19 MED ORDER — LACTATED RINGERS IV SOLN
INTRAVENOUS | Status: DC
  Administered 2018-01-19: 1000 mL via INTRAVENOUS

## 2018-01-19 MED ORDER — 0.9 % SODIUM CHLORIDE (POUR BTL) OPTIME
TOPICAL | Status: DC | PRN
  Administered 2018-01-19: 500 mL

## 2018-01-19 MED ORDER — MEPERIDINE HCL 50 MG/ML IJ SOLN: 6.2500 mg | INTRAMUSCULAR | Status: DC | PRN

## 2018-01-19 MED ORDER — BUPIVACAINE HCL (PF) 0.25 % IJ SOLN
INTRAMUSCULAR | Status: AC
  Filled 2018-01-19: qty 30

## 2018-01-19 MED ORDER — HYDROCODONE-ACETAMINOPHEN 5-325 MG PO TABS
1.0000 | ORAL_TABLET | ORAL | Status: DC | PRN
  Administered 2018-01-19 – 2018-01-20 (×4): 1 via ORAL

## 2018-01-19 MED ORDER — LACTATED RINGERS IV SOLN: INTRAVENOUS | Status: DC

## 2018-01-19 MED ORDER — MIDAZOLAM HCL 2 MG/2ML IJ SOLN
INTRAMUSCULAR | Status: AC
  Filled 2018-01-19: qty 2

## 2018-01-19 MED ORDER — SCOPOLAMINE 1 MG/3DAYS TD PT72
MEDICATED_PATCH | TRANSDERMAL | Status: DC | PRN
  Administered 2018-01-19: 1 via TRANSDERMAL

## 2018-01-19 MED ORDER — HYDROCODONE-ACETAMINOPHEN 5-325 MG PO TABS
ORAL_TABLET | ORAL | Status: AC
  Filled 2018-01-19: qty 1

## 2018-01-19 MED ORDER — KETOROLAC TROMETHAMINE 30 MG/ML IJ SOLN
INTRAMUSCULAR | Status: AC
  Filled 2018-01-19: qty 1

## 2018-01-19 MED ORDER — ARTIFICIAL TEARS OPHTHALMIC OINT
TOPICAL_OINTMENT | OPHTHALMIC | Status: AC
  Filled 2018-01-19: qty 3.5

## 2018-01-19 MED ORDER — FENTANYL CITRATE (PF) 100 MCG/2ML IJ SOLN
INTRAMUSCULAR | Status: DC | PRN
  Administered 2018-01-19 (×3): 50 ug via INTRAVENOUS
  Administered 2018-01-19 (×2): 100 ug via INTRAVENOUS

## 2018-01-19 MED ORDER — KETOROLAC TROMETHAMINE 30 MG/ML IJ SOLN: 30.0000 mg | Freq: Once | INTRAMUSCULAR | Status: DC | PRN

## 2018-01-19 MED ORDER — ROCURONIUM BROMIDE 10 MG/ML (PF) SYRINGE
PREFILLED_SYRINGE | INTRAVENOUS | Status: AC
  Filled 2018-01-19: qty 10

## 2018-01-19 MED ORDER — DEXAMETHASONE SODIUM PHOSPHATE 4 MG/ML IJ SOLN
INTRAMUSCULAR | Status: DC | PRN
  Administered 2018-01-19: 10 mg via INTRAVENOUS

## 2018-01-19 MED ORDER — LIDOCAINE 2% (20 MG/ML) 5 ML SYRINGE
INTRAMUSCULAR | Status: DC | PRN
  Administered 2018-01-19: 1.5 mg/kg/h via INTRAVENOUS

## 2018-01-19 MED ORDER — CEFAZOLIN SODIUM-DEXTROSE 2-4 GM/100ML-% IV SOLN
2.0000 g | INTRAVENOUS | Status: AC
  Administered 2018-01-19: 2 g via INTRAVENOUS
  Filled 2018-01-19: qty 100

## 2018-01-19 MED ORDER — IBUPROFEN 200 MG PO TABS
600.0000 mg | ORAL_TABLET | Freq: Four times a day (QID) | ORAL | Status: DC | PRN
  Administered 2018-01-19 – 2018-01-20 (×2): 600 mg via ORAL

## 2018-01-19 MED ORDER — SUGAMMADEX SODIUM 500 MG/5ML IV SOLN
INTRAVENOUS | Status: AC
  Filled 2018-01-19: qty 5

## 2018-01-19 MED ORDER — KETOROLAC TROMETHAMINE 30 MG/ML IJ SOLN
INTRAMUSCULAR | Status: DC | PRN
  Administered 2018-01-19: 30 mg via INTRAVENOUS

## 2018-01-19 MED ORDER — PROPOFOL 10 MG/ML IV BOLUS
INTRAVENOUS | Status: DC | PRN
  Administered 2018-01-19: 150 mg via INTRAVENOUS
  Administered 2018-01-19: 50 mg via INTRAVENOUS

## 2018-01-19 MED ORDER — PROMETHAZINE HCL 25 MG/ML IJ SOLN: 6.2500 mg | INTRAMUSCULAR | Status: DC | PRN

## 2018-01-19 MED ORDER — SODIUM CHLORIDE 0.9 % IR SOLN
Status: DC | PRN
  Administered 2018-01-19: 3000 mL

## 2018-01-19 MED ORDER — BUPIVACAINE HCL (PF) 0.25 % IJ SOLN
INTRAMUSCULAR | Status: DC | PRN
  Administered 2018-01-19: 15 mL

## 2018-01-19 MED ORDER — PROPOFOL 10 MG/ML IV BOLUS
INTRAVENOUS | Status: AC
  Filled 2018-01-19: qty 40

## 2018-01-19 MED ORDER — TRIAMTERENE-HCTZ 37.5-25 MG PO TABS
1.0000 | ORAL_TABLET | Freq: Every day | ORAL | Status: DC
  Administered 2018-01-19 – 2018-01-20 (×2): 1 via ORAL
  Filled 2018-01-19 (×2): qty 1

## 2018-01-19 MED ORDER — ALBUMIN HUMAN 5 % IV SOLN
INTRAVENOUS | Status: AC
  Filled 2018-01-19: qty 250

## 2018-01-19 MED ORDER — ONDANSETRON HCL 4 MG/2ML IJ SOLN
INTRAMUSCULAR | Status: DC | PRN
  Administered 2018-01-19: 4 mg via INTRAVENOUS

## 2018-01-19 MED ORDER — LIDOCAINE HCL (CARDIAC) PF 100 MG/5ML IV SOSY
PREFILLED_SYRINGE | INTRAVENOUS | Status: DC | PRN
  Administered 2018-01-19: 100 mg via INTRAVENOUS

## 2018-01-19 MED ORDER — FENTANYL CITRATE (PF) 250 MCG/5ML IJ SOLN
INTRAMUSCULAR | Status: AC
  Filled 2018-01-19: qty 5

## 2018-01-19 MED ORDER — ONDANSETRON HCL 4 MG/2ML IJ SOLN
INTRAMUSCULAR | Status: AC
  Filled 2018-01-19: qty 2

## 2018-01-19 MED ORDER — ROCURONIUM BROMIDE 100 MG/10ML IV SOLN
INTRAVENOUS | Status: DC | PRN
  Administered 2018-01-19: 20 mg via INTRAVENOUS
  Administered 2018-01-19: 60 mg via INTRAVENOUS

## 2018-01-19 MED ORDER — HYDROMORPHONE HCL 1 MG/ML IJ SOLN: 0.2500 mg | INTRAMUSCULAR | Status: DC | PRN

## 2018-01-19 MED ORDER — IBUPROFEN 200 MG PO TABS
ORAL_TABLET | ORAL | Status: AC
  Filled 2018-01-19: qty 3

## 2018-01-19 MED ORDER — LIDOCAINE 2% (20 MG/ML) 5 ML SYRINGE
INTRAMUSCULAR | Status: AC
  Filled 2018-01-19: qty 5

## 2018-01-19 MED ORDER — DEXAMETHASONE SODIUM PHOSPHATE 10 MG/ML IJ SOLN
INTRAMUSCULAR | Status: AC
  Filled 2018-01-19: qty 1

## 2018-01-19 MED ORDER — SUGAMMADEX SODIUM 500 MG/5ML IV SOLN
INTRAVENOUS | Status: DC | PRN
  Administered 2018-01-19: 250 mg via INTRAVENOUS

## 2018-01-19 MED ORDER — SCOPOLAMINE 1 MG/3DAYS TD PT72
MEDICATED_PATCH | TRANSDERMAL | Status: AC
  Filled 2018-01-19: qty 1

## 2018-01-19 MED ORDER — MIDAZOLAM HCL 5 MG/5ML IJ SOLN
INTRAMUSCULAR | Status: DC | PRN
  Administered 2018-01-19: 2 mg via INTRAVENOUS

## 2018-01-19 MED ORDER — LIDOCAINE 2% (20 MG/ML) 5 ML SYRINGE
INTRAMUSCULAR | Status: AC
  Filled 2018-01-19: qty 10

## 2018-01-19 SURGICAL SUPPLY — 48 items
ADH SKN CLS APL DERMABOND .7 (GAUZE/BANDAGES/DRESSINGS) ×1
BARRIER ADHS 3X4 INTERCEED (GAUZE/BANDAGES/DRESSINGS) IMPLANT
BRR ADH 4X3 ABS CNTRL BYND (GAUZE/BANDAGES/DRESSINGS)
CANISTER SUCT 3000ML PPV (MISCELLANEOUS) ×3 IMPLANT
CATH FOLEY 3WAY  5CC 16FR (CATHETERS) ×2
CATH FOLEY 3WAY 5CC 16FR (CATHETERS) ×1 IMPLANT
COVER BACK TABLE 60X90IN (DRAPES) ×3 IMPLANT
COVER TIP SHEARS 8 DVNC (MISCELLANEOUS) ×1 IMPLANT
COVER TIP SHEARS 8MM DA VINCI (MISCELLANEOUS) ×2
DEFOGGER SCOPE WARMER CLEARIFY (MISCELLANEOUS) ×3 IMPLANT
DERMABOND ADVANCED (GAUZE/BANDAGES/DRESSINGS) ×2
DERMABOND ADVANCED .7 DNX12 (GAUZE/BANDAGES/DRESSINGS) ×1 IMPLANT
DRAPE ARM DVNC X/XI (DISPOSABLE) ×4 IMPLANT
DRAPE COLUMN DVNC XI (DISPOSABLE) ×1 IMPLANT
DRAPE DA VINCI XI ARM (DISPOSABLE) ×8
DRAPE DA VINCI XI COLUMN (DISPOSABLE) ×2
DURAPREP 26ML APPLICATOR (WOUND CARE) ×3 IMPLANT
ELECT REM PT RETURN 15FT ADLT (MISCELLANEOUS) ×3 IMPLANT
GAUZE VASELINE 1X8 (GAUZE/BANDAGES/DRESSINGS) ×5 IMPLANT
GLOVE BIO SURGEON STRL SZ 6.5 (GLOVE) ×6 IMPLANT
GLOVE BIO SURGEONS STRL SZ 6.5 (GLOVE) ×3
GLOVE BIOGEL PI IND STRL 7.0 (GLOVE) ×5 IMPLANT
GLOVE BIOGEL PI INDICATOR 7.0 (GLOVE) ×10
IRRIG SUCT STRYKERFLOW 2 WTIP (MISCELLANEOUS) ×3
IRRIGATION SUCT STRKRFLW 2 WTP (MISCELLANEOUS) ×1 IMPLANT
LEGGING LITHOTOMY PAIR STRL (DRAPES) ×3 IMPLANT
OBTURATOR OPTICAL STANDARD 8MM (TROCAR) ×2
OBTURATOR OPTICAL STND 8 DVNC (TROCAR) ×1
OBTURATOR OPTICALSTD 8 DVNC (TROCAR) ×1 IMPLANT
OCCLUDER COLPOPNEUMO (BALLOONS) ×3 IMPLANT
PACK ROBOT WH (CUSTOM PROCEDURE TRAY) ×3 IMPLANT
PACK ROBOTIC GOWN (GOWN DISPOSABLE) ×3 IMPLANT
PACK TRENDGUARD 450 HYBRID PRO (MISCELLANEOUS) ×1 IMPLANT
PAD PREP 24X48 CUFFED NSTRL (MISCELLANEOUS) ×3 IMPLANT
POSITIONER SURGICAL ARM (MISCELLANEOUS) ×6 IMPLANT
SEAL CANN UNIV 5-8 DVNC XI (MISCELLANEOUS) ×3 IMPLANT
SEAL XI 5MM-8MM UNIVERSAL (MISCELLANEOUS) ×6
SET CYSTO W/LG BORE CLAMP LF (SET/KITS/TRAYS/PACK) IMPLANT
SET TRI-LUMEN FLTR TB AIRSEAL (TUBING) ×3 IMPLANT
SUT VIC AB 4-0 PS2 27 (SUTURE) ×9 IMPLANT
SUT VICRYL 0 UR6 27IN ABS (SUTURE) ×3 IMPLANT
SUT VLOC 180 0 9IN  GS21 (SUTURE) ×2
SUT VLOC 180 0 9IN GS21 (SUTURE) ×1 IMPLANT
TIP UTERINE 6.7X8CM BLUE DISP (MISCELLANEOUS) ×3 IMPLANT
TOWEL OR 17X26 10 PK STRL BLUE (TOWEL DISPOSABLE) ×3 IMPLANT
TRENDGUARD 450 HYBRID PRO PACK (MISCELLANEOUS) ×3
TROCAR PORT AIRSEAL 5X120 (TROCAR) ×3 IMPLANT
WATER STERILE IRR 1000ML POUR (IV SOLUTION) ×3 IMPLANT

## 2018-01-19 NOTE — Anesthesia Procedure Notes (Signed)
Procedure Name: Intubation Date/Time: 01/19/2018 8:58 AM Performed by: Bonney Aid, CRNA Pre-anesthesia Checklist: Patient identified, Emergency Drugs available, Suction available and Patient being monitored Patient Re-evaluated:Patient Re-evaluated prior to induction Oxygen Delivery Method: Circle system utilized Preoxygenation: Pre-oxygenation with 100% oxygen Induction Type: IV induction Ventilation: Mask ventilation without difficulty Laryngoscope Size: Mac and 3 Grade View: Grade I Tube type: Oral Number of attempts: 1 Airway Equipment and Method: Stylet Placement Confirmation: ETT inserted through vocal cords under direct vision,  positive ETCO2 and breath sounds checked- equal and bilateral Secured at: 21 cm Tube secured with: Tape Dental Injury: Teeth and Oropharynx as per pre-operative assessment

## 2018-01-19 NOTE — H&P (Signed)
Bethany Richardson is an 47 y.o. female. G2P2L2 Married.  Patient referred to me by Dr. Phineas Richardson  RP: Pelvic pain/Persistent Menometrorrhagia for XI Robotic TLH/BSO  HPI: Severe vasomotor menopausal symptoms.  Left ovarian cysts.  Persistent pelvic pain.  Irregular bleeding and Postcoital bleeding.  Pertinent Gynecological History: Menses: Irregular Contraception: tubal ligation Blood transfusions: none Sexually transmitted diseases: no past history Last mammogram: normal  Last pap: Normal  Menstrual History: Patient's last menstrual period was 01/13/2018 (exact date).    Past Medical History:  Diagnosis Date  . Adenomyosis   . ASCUS of cervix with negative high risk HPV 11/2017  . Chronic bronchitis ([place])   . Depression   . GERD (gastroesophageal reflux disease)   . Hypertension   . Menorrhagia   . Obesity   . Pneumonia 2013    Past Surgical History:  Procedure Laterality Date  . HYSTEROSCOPY WITH NOVASURE N/A 10/19/2014   Procedure: HYSTEROSCOPY, ENDOMETRIAL ABLATION;  Surgeon: Bethany Buff, MD;  Location: [facility];  Service: Gynecology;  Laterality: N/A;  Uterine Cavity Length 5.0cm Uterine Cavity Width 4.5cm Power 124 Time 2 minutes  . TUBAL LIGATION Bilateral 1995    Family History  Problem Relation Age of Onset  . Hypertension Father   . Hypertension Mother     Social History:  reports that she quit smoking about 8 years ago. Her smoking use included cigarettes. She has a 40.00 pack-year smoking history. She has never used smokeless tobacco. She reports that she drinks alcohol. She reports that she does not use drugs.  Allergies:  Allergies  Allergen Reactions  . Ace Inhibitors Cough    Medications Prior to Admission  Medication Sig Dispense Refill Last Dose  . acetaminophen (TYLENOL) 500 MG tablet Take 1,000 mg by mouth daily as needed for moderate pain.   01/13/2018  . Calcium Carb-Cholecalciferol (CALCIUM-VITAMIN D) 600-400 MG-UNIT TABS Take 1 tablet by  mouth once a week. On Sunday   01/13/2018  . Ferrous Sulfate (IRON) 28 MG TABS Take 28 mg by mouth daily.   01/13/2018  . naproxen sodium (ALEVE) 220 MG tablet Take 220 mg by mouth daily as needed (pain).   01/13/2018  . triamterene-hydrochlorothiazide (MAXZIDE-25) 37.5-25 MG tablet Take 1 tablet by mouth daily. 30 tablet 5 01/18/2018 at Unknown time    REVIEW OF SYSTEMS: A ROS was performed and pertinent positives and negatives are included in the history.  GENERAL: No fevers or chills. HEENT: No change in vision, no earache, sore throat or sinus congestion. NECK: No pain or stiffness. CARDIOVASCULAR: No chest pain or pressure. No palpitations. PULMONARY: No shortness of breath, cough or wheeze. GASTROINTESTINAL: No abdominal pain, nausea, vomiting or diarrhea, melena or bright red blood per rectum. GENITOURINARY: No urinary frequency, urgency, hesitancy or dysuria. MUSCULOSKELETAL: No joint or muscle pain, no back pain, no recent trauma. DERMATOLOGIC: No rash, no itching, no lesions. ENDOCRINE: No polyuria, polydipsia, no heat or cold intolerance. No recent change in weight. HEMATOLOGICAL: No anemia or easy bruising or bleeding. NEUROLOGIC: No headache, seizures, numbness, tingling or weakness. PSYCHIATRIC: No depression, no loss of interest in normal activity or change in sleep pattern.     Blood pressure (!) 148/89, pulse 75, temperature 98.4 F (36.9 C), temperature source Oral, resp. rate 18, height 5' 6"  (1.676 m), weight 119.3 kg, last menstrual period 01/13/2018, SpO2 99 %.  Physical Exam:  See office notes  12/16/2017 Pelvic US/Sonohysterogram:  Ultrasound transvaginal and transabdominal shows uterus grossly normal in size with normal  echotexture. Endometrial echo 14.2 mm. Right ovary normal. Left ovary noted to have 4 cysts #138 x 38 mm with calcification anterior wall 12 x 5 mm, #2 33 x 19 mm, #3 17 x 17 mm, #4 22 x 19 mm. All echo-free. All without color flow Doppler. Cul-de-sac  negative  Sonohysterogram performed, sterile technique, easy catheter introduction, good distention with no abnormality seen. Endometrial biopsy taken. Patient tolerated well  EBx 12/16/2017: Endometrium, biopsy, uterus - BENIGN EARLY SECRETORY ENDOMETRIUM. - NO HYPERPLASIA OR MALIGNANCY IDENTIFIED.  Assessment/Plan:  47 y.o. G2P2   1. Menometrorrhagia Persistence menometrorrhagia with pelvic pain and left ovarian cysts in perimenopause with severe vasomotor symptoms.  Endometrial biopsy benign.  Patient of Dr. Phineas Richardson initially who sent her to me to perform her hysterectomy using the [place] robot given that the uterus is not descending at all vaginally, which would have made an LAVH more difficult. Decision to proceed with XI Robotic total laparoscopic hysterectomy with bilateral salpingo-oophorectomy.  Surgery and risks reviewed thoroughly with patient.  Including the risk of trauma, hemorrhage, blood transfusion, infection, DVT/PE, anesthesiology complication.  The preop preparation as well as the postop management and expectations reviewed with patient.  Patient voiced understanding and agreement with the plan.  2. Female pelvic pain As above  3. Left ovarian cyst As above                        Patient was counseled as to the risk of surgery to include the following:  1. Infection (prohylactic antibiotics will be administered)  2. DVT/Pulmonary Embolism (prophylactic pneumo compression stockings will be used)  3.Trauma to internal organs requiring additional surgical procedure to repair any injury to internal organs requiring perhaps additional hospitalization days.  4.Hemmorhage requiring transfusion and blood products which carry risks such as anaphylactic reaction, hepatitis and AIDS  Patient had received literature information on the procedure scheduled and all her questions were answered and fully accepts all risk.   Bethany Richardson 01/19/2018, 8:07 AM

## 2018-01-19 NOTE — Transfer of Care (Signed)
Immediate Anesthesia Transfer of Care Note  Patient: Bethany Richardson  Procedure(s) Performed: XI ROBOTIC ASSISTED TOTAL HYSTERECTOMY WITH BILATERAL SALPINGO OOPHORECTOMY (Bilateral )  Patient Location: PACU  Anesthesia Type:General  Level of Consciousness: awake and oriented  Airway & Oxygen Therapy: Patient Spontanous Breathing and Patient connected to face mask oxygen  Post-op Assessment: Report given to RN  Post vital signs: Reviewed and stable  Last Vitals:  Vitals Value Taken Time  BP 151/76 01/19/2018 11:11 AM  Temp    Pulse 96 01/19/2018 11:13 AM  Resp 16 01/19/2018 11:13 AM  SpO2 100 % 01/19/2018 11:13 AM  Vitals shown include unvalidated device data.  Last Pain:  Vitals:   01/19/18 0702  TempSrc:   PainSc: 0-No pain      Patients Stated Pain Goal: 4 (44/97/53 0051)  Complications: No apparent anesthesia complications

## 2018-01-19 NOTE — Anesthesia Postprocedure Evaluation (Signed)
Anesthesia Post Note  Patient: Bethany Richardson  Procedure(s) Performed: XI ROBOTIC ASSISTED TOTAL HYSTERECTOMY WITH BILATERAL SALPINGO OOPHORECTOMY (Bilateral )     Patient location during evaluation: PACU Anesthesia Type: General Level of consciousness: awake Pain management: pain level controlled Vital Signs Assessment: post-procedure vital signs reviewed and stable Respiratory status: spontaneous breathing Cardiovascular status: stable Postop Assessment: no apparent nausea or vomiting Anesthetic complications: no    Last Vitals:  Vitals:   01/19/18 1230 01/19/18 1300  BP: (!) 145/79 (!) 162/101  Pulse: 64 72  Resp: 20 18  Temp: 36.8 C (!) 36.3 C  SpO2: 99% 100%    Last Pain:  Vitals:   01/19/18 1300  TempSrc:   PainSc: 8    Pain Goal: Patients Stated Pain Goal: 4 (01/19/18 1230)               [place]

## 2018-01-19 NOTE — Op Note (Signed)
Operative Note  01/19/2018  11:03 AM  PATIENT:  Bethany Richardson  47 y.o. female  PRE-OPERATIVE DIAGNOSIS:  Menorrhagia with irregular cycles, pelvic pain  POST-OPERATIVE DIAGNOSIS:  Menorrhagia with irregular cycles, pelvic pain  PROCEDURE:  Procedure(s): XI ROBOTIC ASSISTED TOTAL HYSTERECTOMY WITH BILATERAL SALPINGO OOPHORECTOMY  SURGEON:  Surgeon(s): Princess Bruins, MD Fontaine, Belinda Block, MD  ANESTHESIA:   general  FINDINGS: Uterus normal, status post bilateral tubal ligation, bilateral normal ovaries  DESCRIPTION OF OPERATION: Under general anesthesia with endotracheal intubation the patient is in lithotomy position.  She is prepped with DuraPrep on the abdomen and with Betadine on the suprapubic, vulvar and vaginal areas.  The patient is draped as usual.  Timeout is done.  Ancef 2 g IV was given.  PAS were on at the lower limbs.  The Foley was inserted in the bladder.  The weighted speculum is inserted in the vagina.  The anterior lip of the cervix is grasped with a tenaculum.  The hysterometry is at 9 cm.  A #8 Rumi with the medium Ko-ring were put in place easily.  The tenaculum is removed as well as the weighted speculum.        Abdominally, the supraumbilical area is infiltrated with Marcaine one quarter plain.  A 1.5 cm incision is made with a scalpel at that level.  The aponeurosis is grasped with Coker's.  The aponeurosis is opened with Mayo scissors.  The parietal peritoneum is opened bluntly with the finger.  A pursestring stitch of Vicryl 0 was done on the aponeurosis.  The Sheryle Hail is inserted at that level and up pneumoperitoneum is created with CO2.  Inspection of the abdominal pelvic cavities reveal no adhesion with the anterior wall of the abdomen.  The uterus is normal in size and appearance.  Both tubes are status post tubal ligation.  Both ovaries are normal in size and appearance.  The robotic ports as well as the assistant ports are inserted under direct vision with 2  robotic ports on the right and one robotic port on the lateral left.  The assistant ports a 5 mm port is inserted at the medial left.  The patient is position in 30 degree Trendelenburg.  The robot is docked on the right side.  Robotic instruments are inserted under direct vision with the fenestrated clamp and the fourth arm, the EndoShears scissors in the third arm, the camera in the second arm and the fenestrated bipolar in the first arm.  I go to the console.      Inspection of the abdomen and pelvis reveals no pathology.  Both ureters are normal anatomic position with good peristalsis.  We start on the right side with cauterization and section of the right infundibulopelvic ligament, we followed the right ovary at its lower border with cauterization and section.  We then cauterized and section the right round ligament.  We opened the broad ligament and cauterized and section the anterior visceral peritoneum to the midline.  We proceeded exactly the same way on the left side.  The visceral peritoneum is completely opened anteriorly and the bladder is distended well past the Ko ring.  We then cauterized the right uterine artery, then cauterized and sectioned the left uterine artery and went back on the right side to cauterize again and section.  The vaginal occluder is filled.  The superior vagina is opened on top of the Koh ring with the tip of the Endo Shears scissors starting anteriorly and then moving to  the sides we then going posteriorly and completely detaching the uterus with both tubes, both ovaries and the cervix.  The specimen is passed vaginally and sent to pathology.  Hemostasis is controled at the vaginal vault with the bipolar.  We then switched the instruments with the cutting needle driver in the third arm and the long tip in the first arm.  A V lock 0 at 9 inches is used to close the vaginal vault with a running suture starting at the right angle, going to the left angle and coming past the  midline.  Hemostasis is adequate at all levels.  Pictures were taken at the beginning and at the end of the procedure.  All robotic instruments were removed.  The robot was undocked.  We went to laparoscopy time.      The patient was removed from deep Trendelenburg.  We irrigated and suction the pelvic cavity.  Hemostasis was confirmed once more.  Laparoscopic instruments were removed.  All ports were removed under direct vision.  The CO2 was evacuated.  The supraumbilical incision was closed by attaching the pursestring stitch.  A small defect persisted at the aponeurosis after doing so, therefore that space was closed with a running suture of Vicryl 0.  A pursestring stitch of Vicryl 4-0 was used at the adipose tissue of the supraumbilical incision to close the space.  All incisions were closed at the skin with separate stitches of Vicryl 4-0.  Dermabond was added on all incisions.  The vaginal occluder was removed from the vagina.  Hemostasis was adequate at that level.  The patient was brought to recovery room in good and stable status.  ESTIMATED BLOOD LOSS: 100 mL   Intake/Output Summary (Last 24 hours) at 01/19/2018 1103 Last data filed at 01/19/2018 1057 Gross per 24 hour  Intake 650 ml  Output 150 ml  Net 500 ml     BLOOD ADMINISTERED:none   LOCAL MEDICATIONS USED:  MARCAINE     SPECIMEN:  Source of Specimen:  Uterus with cervix, bilateral tubes and bilateral ovaries  DISPOSITION OF SPECIMEN:  PATHOLOGY  COUNTS:  YES  PLAN OF CARE: Transfer to PACU  Marie-Lyne LavoieMD11:03 AM

## 2018-01-20 ENCOUNTER — Encounter (HOSPITAL_COMMUNITY): Payer: Self-pay | Admitting: Obstetrics & Gynecology

## 2018-01-20 DIAGNOSIS — N921 Excessive and frequent menstruation with irregular cycle: Secondary | ICD-10-CM | POA: Diagnosis not present

## 2018-01-20 LAB — CBC
HEMATOCRIT: 41.6 % (ref 36.0–46.0)
Hemoglobin: 13.8 g/dL (ref 12.0–15.0)
MCH: 26.5 pg (ref 26.0–34.0)
MCHC: 33.2 g/dL (ref 30.0–36.0)
MCV: 80 fL (ref 78.0–100.0)
Platelets: 254 10*3/uL (ref 150–400)
RBC: 5.2 MIL/uL — ABNORMAL HIGH (ref 3.87–5.11)
RDW: 14.7 % (ref 11.5–15.5)
WBC: 13.6 10*3/uL — ABNORMAL HIGH (ref 4.0–10.5)

## 2018-01-20 MED ORDER — IBUPROFEN 200 MG PO TABS
ORAL_TABLET | ORAL | Status: AC
  Filled 2018-01-20: qty 3

## 2018-01-20 MED ORDER — OXYCODONE-ACETAMINOPHEN 7.5-325 MG PO TABS: 1.0000 | ORAL_TABLET | Freq: Four times a day (QID) | ORAL | 0 refills | Status: DC | PRN

## 2018-01-20 NOTE — Progress Notes (Signed)
POD #1  Subjective: Patient reports tolerating PO and no problems voiding.    Objective: I have reviewed patient's vital signs.  vital signs, intake and output, medications and labs.  Vitals:   01/20/18 0151 01/20/18 0519  BP: 134/76 134/72  Pulse: 86 82  Resp: 18 16  Temp: 97.7 F (36.5 C) 97.8 F (36.6 C)  SpO2: 99% 97%   I/O last 3 completed shifts: In: 2810 [P.O.:1200; I.V.:1510; IV Piggyback:100] Out: 2150 [Urine:2050; Blood:100] No intake/output data recorded.  Results for orders placed or performed during the hospital encounter of 01/19/18 (from the past 24 hour(s))  CBC     Status: Abnormal   Collection Time: 01/20/18  5:18 AM  Result Value Ref Range   WBC 13.6 (H) 4.0 - 10.5 K/uL   RBC 5.20 (H) 3.87 - 5.11 MIL/uL   Hemoglobin 13.8 12.0 - 15.0 g/dL   HCT 41.6 36.0 - 46.0 %   MCV 80.0 78.0 - 100.0 fL   MCH 26.5 26.0 - 34.0 pg   MCHC 33.2 30.0 - 36.0 g/dL   RDW 14.7 11.5 - 15.5 %   Platelets 254 150 - 400 K/uL    EXAM General: alert and cooperative Resp: clear to auscultation bilaterally Cardio: regular rate and rhythm GI: soft, non-tender; bowel sounds normal; no masses,  no organomegaly and incision: clean, dry and intact Extremities: no edema, redness or tenderness in the calves or thighs Vaginal Bleeding: none  Assessment: s/p Procedure(s): XI ROBOTIC ASSISTED TOTAL HYSTERECTOMY WITH BILATERAL SALPINGO OOPHORECTOMY: stable and progressing well  Plan: Advance diet Encourage ambulation Discharge home  LOS: 0 days    Princess Bruins, MD 01/20/2018 8:03 AM

## 2018-01-20 NOTE — Discharge Summary (Signed)
Physician Discharge Summary  Patient ID: Bethany Richardson MRN: [id number] DOB/AGE: 1970-11-18 47 y.o.  Admit date: 01/19/2018 Discharge date: 01/20/2018  Admission Diagnoses: menorrhagia with irregular cycles   Discharge Diagnoses:  Same Active Problems:   Post-operative state   Discharged Condition: good  Consults:None  Significant Diagnostic Studies: labs: CBC normal  Treatments:surgery: XI Robotic Total Laparoscopic Hysterectomy with Bilateral Salpingo-Oophorectomy  Vitals:   01/20/18 0151 01/20/18 0519  BP: 134/76 134/72  Pulse: 86 82  Resp: 18 16  Temp: 97.7 F (36.5 C) 97.8 F (36.6 C)  SpO2: 99% 97%     No intake/output data recorded.   Hospital Course: Good  Discharge Exam: Normal  Disposition: Discharge home     Allergies as of 01/20/2018      Reactions   Ace Inhibitors Cough      Medication List    STOP taking these medications   Iron 28 MG Tabs     TAKE these medications   acetaminophen 500 MG tablet Commonly known as:  TYLENOL Take 1,000 mg by mouth daily as needed for moderate pain.   Calcium-Vitamin D 600-400 MG-UNIT Tabs Take 1 tablet by mouth once a week. On Sunday   naproxen sodium 220 MG tablet Commonly known as:  ALEVE Take 220 mg by mouth daily as needed (pain).   oxyCODONE-acetaminophen 7.5-325 MG tablet Commonly known as:  PERCOCET Take 1 tablet by mouth every 6 (six) hours as needed for severe pain.   triamterene-hydrochlorothiazide 37.5-25 MG tablet Commonly known as:  MAXZIDE-25 Take 1 tablet by mouth daily.          SignedPrincess Bruins 01/20/2018, 8:01 AM

## 2018-01-26 ENCOUNTER — Telehealth: Payer: Self-pay

## 2018-01-26 NOTE — Telephone Encounter (Signed)
First of all I would be happy to see her in the office.  But from what she describes, as long as she is eating, drinking, voiding and having bowel movements, I think everything overall sounds okay.  Her blood count after the surgery was great at hemoglobin 13.  Not unusual within the week of surgery to get a little uneasy when we get up to move around.  I would make sure she is pushing the fluids to keep herself hydrated.  I would not worry about eating meat but I would make sure she has something solid on her stomach.  Also not unusual to have some burning sensation around the incision sites as a heel.  As long as there is no redness or drainage to suggest infection that I would watch.  But again if she would feel more comfortable I can take a look in the office

## 2018-01-26 NOTE — Telephone Encounter (Signed)
Dr. Mariah Milling patient who had Robotic TLH, BSO on 01/19/18.  C/O experiencing some nausea and light headedness when she gets up to move around.  Just stays up a little bit and when she sits back down it resolves. She said Dr. Marguerita Merles told her if she started having nausea to eat some red meat but she does not eat red meat. Want to make sure this sounds ok and if you could recommend anything to help with it.  Also c/o "Belly button burns."  Upon questioning she said it started in the hospital but continues where the top of the belly button burns. All the incisions are fine just a burning at top of umblicius.  No UTI sx. No known fever. She said a couple of times she felt a little warm and took a Tylenol and it went away. Never checked it.

## 2018-01-26 NOTE — Telephone Encounter (Signed)
Read patient Dr. Zelphia Cairo reply note. She felt reassured and declined office visit. She will call if she needs [place].

## 2018-02-02 ENCOUNTER — Telehealth: Payer: Self-pay | Admitting: *Deleted

## 2018-02-02 NOTE — Telephone Encounter (Signed)
Patient called c/o brownish discharge this am when wiping after using bathroom. No red blood noted, no pain, no other symptoms. Post total hysterectomy on 01/19/18  Has post op appointment desk on 02/09/17.

## 2018-02-07 ENCOUNTER — Ambulatory Visit (INDEPENDENT_AMBULATORY_CARE_PROVIDER_SITE_OTHER): Payer: BLUE CROSS/BLUE SHIELD | Admitting: Obstetrics & Gynecology

## 2018-02-07 ENCOUNTER — Encounter: Payer: Self-pay | Admitting: Obstetrics & Gynecology

## 2018-02-07 VITALS — BP 138/88

## 2018-02-07 DIAGNOSIS — Z09 Encounter for follow-up examination after completed treatment for conditions other than malignant neoplasm: Secondary | ICD-10-CM

## 2018-02-07 NOTE — Patient Instructions (Signed)
1. Status post gynecological surgery, follow-up exam Very good postop evolution with no complication.  Except for very small superficial skin the high sense of the left distal incision.  Incision clean and Steri-Strip applied.  Recommend continuing with showers to keep it clean and applying a Band-Aid when more active during the day until the skin is completely closed if the Steri-Strip falls off.  Follow-up in 3 to 4 weeks to reassess the skin incisions and the vaginal vault.  Bethany Richardson, good seeing you today!

## 2018-02-07 NOTE — Progress Notes (Signed)
    Bethany Richardson [birth year] [id number]        47 y.o.  G2P2L2 Married  RP: 3 weeks post robotic TLH with BSO  HPI: Good postop evolution with no pelvic pain.  Only brownish vaginal discharge.  Had mild drainage of the left most distal incision.  Urine and bowel movements normal.  No fever.   OB History  Gravida Para Term Preterm AB Living  2 2       2   SAB TAB Ectopic Multiple Live Births               # Outcome Date GA Lbr Len/2nd Weight Sex Delivery Anes PTL Lv  2 Para           1 Para             Past medical history,surgical history, problem list, medications, allergies, family history and social history were all reviewed and documented in the EPIC chart.   Directed ROS with pertinent positives and negatives documented in the history of present illness/assessment and plan.  Exam:  Vitals:   02/07/18 1542  BP: 138/88   General appearance:  Normal  Abdomen: Soft, nontender.  All incisions well closed except the left distal incision which has a small superficial dehiscence.  No drainage currently.  No cellulitis.  Cleaned with Betadine.  Glue removed at all incision sites.  Steristrips applied.  Gynecologic exam: Vulva normal.  Vaginal vault well closed.  Brownish discharge present.  No sign of infection.   Assessment/Plan:  47 y.o. G2P2   1. Status post gynecological surgery, follow-up exam Very good postop evolution with no complication.  Except for very small superficial skin the high sense of the left distal incision.  Incision clean and Steri-Strip applied.  Recommend continuing with showers to keep it clean and applying a Band-Aid when more active during the day until the skin is completely closed if the Steri-Strip falls off.  Follow-up in 3 to 4 weeks to reassess the skin incisions and the vaginal vault.  Princess Bruins MD, 4:01 PM 02/07/2018

## 2018-02-08 DIAGNOSIS — Z0289 Encounter for other administrative examinations: Secondary | ICD-10-CM

## 2018-02-09 ENCOUNTER — Ambulatory Visit: Payer: BLUE CROSS/BLUE SHIELD | Admitting: Obstetrics & Gynecology

## 2018-02-28 ENCOUNTER — Ambulatory Visit: Payer: BLUE CROSS/BLUE SHIELD | Admitting: Obstetrics & Gynecology

## 2018-03-02 ENCOUNTER — Encounter: Payer: Self-pay | Admitting: Obstetrics & Gynecology

## 2018-03-02 ENCOUNTER — Ambulatory Visit (INDEPENDENT_AMBULATORY_CARE_PROVIDER_SITE_OTHER): Payer: BLUE CROSS/BLUE SHIELD | Admitting: Obstetrics & Gynecology

## 2018-03-02 VITALS — BP 154/80

## 2018-03-02 DIAGNOSIS — N898 Other specified noninflammatory disorders of vagina: Secondary | ICD-10-CM

## 2018-03-02 DIAGNOSIS — Z23 Encounter for immunization: Secondary | ICD-10-CM | POA: Diagnosis not present

## 2018-03-02 DIAGNOSIS — E8940 Asymptomatic postprocedural ovarian failure: Secondary | ICD-10-CM

## 2018-03-02 DIAGNOSIS — Z09 Encounter for follow-up examination after completed treatment for conditions other than malignant neoplasm: Secondary | ICD-10-CM

## 2018-03-02 LAB — WET PREP FOR TRICH, YEAST, CLUE

## 2018-03-02 NOTE — Progress Notes (Signed)
    Bethany Richardson 03-Apr-1971 [id number]        47 y.o. G2P2L2 married  RP: Postop robotic TLH/BSO on January 19, 2018  HPI: Very good postop progression and healing.  No abdominopelvic pain.  Incisions well-healed.  No vaginal bleeding.  Complains of mild hot flashes and night sweats.  Urine and bowel movements normal.  No fever.   OB History  Gravida Para Term Preterm AB Living  2 2       2   SAB TAB Ectopic Multiple Live Births               # Outcome Date GA Lbr Len/2nd Weight Sex Delivery Anes PTL Lv  2 Para           1 Para             Past medical history,surgical history, problem list, medications, allergies, family history and social history were all reviewed and documented in the EPIC chart.   Directed ROS with pertinent positives and negatives documented in the history of present illness/assessment and plan.  Exam:  Vitals:   03/02/18 1110  BP: (!) 154/80   General appearance:  Normal  Abdomen: Incisions well-healed, skin closed, no sign of infection.  Gynecologic exam: Vulva normal.  Speculum: Vaginal vault well-healed.  No vaginal bleeding.  Mild increase in secretions, wet prep done.  Vaginal exam: No induration felt at the vaginal vault, nontender, no pelvic mass felt.  Wet prep: Negative   Assessment/Plan:  47 y.o. G2P2   1. Status post gynecological surgery, follow-up exam Good postop progression and healing.  No complication.  Will return to work and may resume sexual activity.  2. Surgical menopause Complains of mild hot flashes and night sweats.  Hormone replacement therapy discussed with patient with thorough review of benefits and risks.  Will observe at this time and consider hormone replacement therapy if symptoms become more severe.  3. Vaginal odor Wet prep negative, patient reassured. - WET PREP FOR [place], YEAST, CLUE  4. Needs flu shot Flu shot given.  Other orders - Flu Vaccine QUAD 36+ mos IM (Fluarix, Quad PF)  Counseling on  above issues and coordination of care more than 50% for 15 minutes.  Princess Bruins MD, 11:29 AM 03/02/2018

## 2018-03-04 ENCOUNTER — Encounter: Payer: Self-pay | Admitting: Anesthesiology

## 2018-03-06 ENCOUNTER — Encounter: Payer: Self-pay | Admitting: Obstetrics & Gynecology

## 2018-03-06 NOTE — Patient Instructions (Signed)
1. Status post gynecological surgery, follow-up exam Good postop progression and healing.  No complication.  Will return to work and may resume sexual activity.  2. Surgical menopause Complains of mild hot flashes and night sweats.  Hormone replacement therapy discussed with patient with thorough review of benefits and risks.  Will observe at this time and consider hormone replacement therapy if symptoms become more severe.  3. Vaginal odor Wet prep negative, patient reassured. - WET PREP FOR [place], YEAST, CLUE  4. Needs flu shot Flu shot given.  Other orders - Flu Vaccine QUAD 36+ mos IM (Fluarix, Quad PF)  Karmah, it was a pleasure seeing you today!

## 2018-03-17 ENCOUNTER — Other Ambulatory Visit: Payer: Self-pay | Admitting: Family Medicine

## 2018-03-18 NOTE — Telephone Encounter (Signed)
Last seen 11/12/17

## 2018-03-21 ENCOUNTER — Telehealth: Payer: Self-pay | Admitting: *Deleted

## 2018-03-21 NOTE — Telephone Encounter (Signed)
Patient called requesting Rx for Contrave can be called in. I called patient back and left message on voicemail she will need to schedule OV with provider to discuss.

## 2018-03-25 ENCOUNTER — Telehealth: Payer: Self-pay | Admitting: *Deleted

## 2018-03-25 NOTE — Telephone Encounter (Signed)
Patient called c/o hot flashes, night sweats, and feeling mean. I recommended scheduling OV, transferred to appointment desk to schedule.

## 2018-04-06 ENCOUNTER — Ambulatory Visit: Payer: BLUE CROSS/BLUE SHIELD | Admitting: Obstetrics & Gynecology

## 2018-04-06 ENCOUNTER — Ambulatory Visit (INDEPENDENT_AMBULATORY_CARE_PROVIDER_SITE_OTHER): Payer: BLUE CROSS/BLUE SHIELD | Admitting: Obstetrics & Gynecology

## 2018-04-06 ENCOUNTER — Encounter: Payer: Self-pay | Admitting: Obstetrics & Gynecology

## 2018-04-06 VITALS — BP 136/88

## 2018-04-06 DIAGNOSIS — N951 Menopausal and female climacteric states: Secondary | ICD-10-CM

## 2018-04-06 DIAGNOSIS — Z6841 Body Mass Index (BMI) 40.0 and over, adult: Secondary | ICD-10-CM

## 2018-04-06 MED ORDER — PHENTERMINE HCL 37.5 MG PO CAPS: 37.5000 mg | ORAL_CAPSULE | ORAL | 2 refills | Status: DC

## 2018-04-06 MED ORDER — ESTRADIOL 0.1 MG/24HR TD PTWK: 0.1000 mg | MEDICATED_PATCH | TRANSDERMAL | 4 refills | Status: DC

## 2018-04-06 NOTE — Patient Instructions (Signed)
1. Menopausal syndrome Surgical menopause post robotic TLH with BSO on January 19, 2018.  Severe menopausal syndrome with hot flushes and night sweats many times every day and every night.  Night sweats causing insomnia and fatigue.  No contraindication to hormone replacement therapy.  No personal history of blood clots and no personal history or family history of breast cancer.  Decision to start on estradiol patch 0.1 weekly.  Usage, risks and benefits reviewed thoroughly with patient.  Risks of blood clots/strokes increasing with age discussed.  Small increased risk of breast cancer after 10 years of being on hormone replacement therapy reviewed.  The benefits and controlling the vasomotor symptoms, cardiovascular protection and bone protection reviewed.  Prescription sent to pharmacy.  2. Class 3 severe obesity due to excess calories without serious comorbidity with body mass index (BMI) of 40.0 to 44.9 in adult [facility]) Obesity with body mass index of 41.3.  Recommend a low calorie/low carb diet such as [employer].  Aerobic physical activities 5 times a week with weightlifting every 2 days discussed and recommended.  Will start on phentermine 1 capsule daily for 3 months to help by decreasing patient's appetite.  Will verify her blood pressure after 1 month on phentermine.  Other orders - phentermine 37.5 MG capsule; Take 1 capsule (37.5 mg total) by mouth every morning. - estradiol (CLIMARA - DOSED IN MG/24 HR) 0.1 mg/24hr patch; Place 1 patch (0.1 mg total) onto the skin once a week.  Bethany Richardson, it was a pleasure seeing you today!

## 2018-04-06 NOTE — Progress Notes (Signed)
    Bethany Richardson 01/05/1971 [id number]        47 y.o.  G2P2L2  RP: Severe hot flushes and night sweats x 2 weeks  HPI: Had XI ROBOTIC ASSISTED TOTAL HYSTERECTOMY WITH BILATERAL SALPINGO OOPHORECTOMY on 01/19/2018.  Severe hot flushes and night sweats x 2 weeks.  The night sweats are causing insomnia.  No pain/dryness with IC.  No personal h/o blood clots.  No personal or family h/o breast Ca.  Last BMI 41.3.  Wants to loose weight.   OB History  Gravida Para Term Preterm AB Living  2 2       2   SAB TAB Ectopic Multiple Live Births               # Outcome Date GA Lbr Len/2nd Weight Sex Delivery Anes PTL Lv  2 Para           1 Para             Past medical history,surgical history, problem list, medications, allergies, family history and social history were all reviewed and documented in the EPIC chart.   Directed ROS with pertinent positives and negatives documented in the history of present illness/assessment and plan.  Exam:  Vitals:   04/06/18 1106  BP: 136/88   General appearance:  Normal    Assessment/Plan:  47 y.o. G2P2   1. Menopausal syndrome Surgical menopause post robotic TLH with BSO on January 19, 2018.  Severe menopausal syndrome with hot flushes and night sweats many times every day and every night.  Night sweats causing insomnia and fatigue.  No contraindication to hormone replacement therapy.  No personal history of blood clots and no personal history or family history of breast cancer.  Decision to start on estradiol patch 0.1 weekly.  Usage, risks and benefits reviewed thoroughly with patient.  Risks of blood clots/strokes increasing with age discussed.  Small increased risk of breast cancer after 10 years of being on hormone replacement therapy reviewed.  The benefits and controlling the vasomotor symptoms, cardiovascular protection and bone protection reviewed.  Prescription sent to pharmacy.  2. Class 3 severe obesity due to excess calories without  serious comorbidity with body mass index (BMI) of 40.0 to 44.9 in adult [facility]) Obesity with body mass index of 41.3.  Recommend a low calorie/low carb diet such as [employer].  Aerobic physical activities 5 times a week with weightlifting every 2 days discussed and recommended.  Will start on phentermine 1 capsule daily for 3 months to help by decreasing patient's appetite.  Will verify her blood pressure after 1 month on phentermine.  Other orders - phentermine 37.5 MG capsule; Take 1 capsule (37.5 mg total) by mouth every morning. - estradiol (CLIMARA - DOSED IN MG/24 HR) 0.1 mg/24hr patch; Place 1 patch (0.1 mg total) onto the skin once a week.  Counseling on above issues and coordination of care more than 50% for 25 minutes.  Princess Bruins MD, 11:34 AM 04/06/2018

## 2018-04-12 ENCOUNTER — Telehealth: Payer: Self-pay

## 2018-04-12 MED ORDER — ESTRADIOL 0.1 MG/24HR TD PTTW: 1.0000 | MEDICATED_PATCH | TRANSDERMAL | 4 refills | Status: DC

## 2018-04-12 NOTE — Telephone Encounter (Signed)
Patient called because the generic estradiol 0.1 mg patch you prescribed for her is on backorder. Her pharmacy has one box (4 patches) but cannot guarantee her that at end of that month she can get any more. Patient wants to know what you rec?

## 2018-04-12 NOTE — Telephone Encounter (Signed)
Send prescription for Estradiol patch 0.1  Twice a week (Vivelle)  #24, refill x 4.

## 2018-04-12 NOTE — Telephone Encounter (Signed)
Rx sent. Patient informed.

## 2018-05-07 ENCOUNTER — Other Ambulatory Visit: Payer: Self-pay | Admitting: Family Medicine

## 2018-05-13 ENCOUNTER — Ambulatory Visit: Payer: BLUE CROSS/BLUE SHIELD | Admitting: Family Medicine

## 2018-05-26 ENCOUNTER — Other Ambulatory Visit: Payer: Self-pay | Admitting: Family Medicine

## 2018-06-13 ENCOUNTER — Other Ambulatory Visit: Payer: Self-pay | Admitting: Obstetrics & Gynecology

## 2018-06-13 ENCOUNTER — Telehealth: Payer: Self-pay | Admitting: Obstetrics & Gynecology

## 2018-06-13 ENCOUNTER — Encounter: Payer: Self-pay | Admitting: Obstetrics & Gynecology

## 2018-06-13 MED ORDER — PHENAZOPYRIDINE HCL 100 MG PO TABS: 100.0000 mg | ORAL_TABLET | Freq: Three times a day (TID) | ORAL | 0 refills | Status: DC | PRN

## 2018-06-13 MED ORDER — SULFAMETHOXAZOLE-TRIMETHOPRIM 800-160 MG PO TABS: 1.0000 | ORAL_TABLET | Freq: Two times a day (BID) | ORAL | 0 refills | Status: DC

## 2018-06-13 NOTE — Telephone Encounter (Signed)
48 yo WF who underwent a robotic TLH/BSO in September, now on HRT, called with about 24 hours of worsening urinary urgency and dysuria.  Just in the last while, she's started to note blood in her urine when she's gone to the bathroom.  She is also having a lot of hesitancy when she voids.  Denies fever or back pain.  She is experiencing some pelvic pressure. Is sure the bleeding is not vaginal or rectal.     She is on HRT and was using the Climara patch.  She needed to switch to the vivelle dot a few weeks ago due to availability of the climara patch.  She's started having issues with a skin reaction to the adhesive in the Climara patch as well.  Asks if any of the urinary issues have anything to do with switching back and forth between the patches.  Advised I doubt that is the case.    She is not diabetic.  She does have hypertension.  Asks if she should go to the ER.  Offered to send in rx for antibiotics because it sounds like this is hemorrhagic cystitis.  I feel this is ok to treat over the phone but advised to go if symptoms worsen, if she starts having nausea with emesis and/or if she prefers to be seen in the ER tonight.  Does not and would like rx called into pharmacy.  Lives in [place] so rx will be sent to [facility] on [address] as her pharmacy is already closed.    Rx for bactrim DS BID x 3 day and pyridium 162m tid for three days to pharmacy.  Advised to also be seen in office for follow up.

## 2018-08-22 ENCOUNTER — Other Ambulatory Visit: Payer: Self-pay | Admitting: Family Medicine

## 2018-08-25 ENCOUNTER — Other Ambulatory Visit: Payer: Self-pay | Admitting: Family Medicine

## 2018-08-30 ENCOUNTER — Other Ambulatory Visit: Payer: Self-pay

## 2018-08-31 ENCOUNTER — Encounter: Payer: Self-pay | Admitting: Obstetrics & Gynecology

## 2018-08-31 ENCOUNTER — Ambulatory Visit (INDEPENDENT_AMBULATORY_CARE_PROVIDER_SITE_OTHER): Payer: BLUE CROSS/BLUE SHIELD | Admitting: Obstetrics & Gynecology

## 2018-08-31 VITALS — BP 148/86 | Ht 66.5 in | Wt 245.0 lb

## 2018-08-31 DIAGNOSIS — Z6838 Body mass index (BMI) 38.0-38.9, adult: Secondary | ICD-10-CM

## 2018-08-31 DIAGNOSIS — E6609 Other obesity due to excess calories: Secondary | ICD-10-CM | POA: Diagnosis not present

## 2018-08-31 DIAGNOSIS — I10 Essential (primary) hypertension: Secondary | ICD-10-CM | POA: Diagnosis not present

## 2018-08-31 MED ORDER — TRIAMTERENE-HCTZ 37.5-25 MG PO TABS: 1.0000 | ORAL_TABLET | Freq: Every day | ORAL | 0 refills | Status: DC

## 2018-08-31 MED ORDER — ESTRADIOL 0.1 MG/24HR TD PTTW: 1.0000 | MEDICATED_PATCH | TRANSDERMAL | 1 refills | Status: DC

## 2018-08-31 NOTE — Progress Notes (Signed)
    Bethany Richardson 1970-11-27 [id number]        48 y.o.  G2P2L2  RP: Obesity for weight loss management  HPI: Patient has taken phentermine for weight loss for 2 months, successfully losing weight.  BMI was 42.27 on 01/19/2018 at the time of her Robotic TLH/BSO and is now at 38.95.  Patient has chronic hypertension on Maxzide.  On a low calorie/carb diet and exercising regularly.   OB History  Gravida Para Term Preterm AB Living  2 2       2   SAB TAB Ectopic Multiple Live Births               # Outcome Date GA Lbr Len/2nd Weight Sex Delivery Anes PTL Lv  2 Para           1 Para             Past medical history,surgical history, problem list, medications, allergies, family history and social history were all reviewed and documented in the EPIC chart.   Directed ROS with pertinent positives and negatives documented in the history of present illness/assessment and plan.  Exam:  Vitals:   08/31/18 1015  BP: (!) 148/86  Weight: 245 lb (111.1 kg)  Height: 5' 6.5" (1.689 m)  Body mass index 38.95  General appearance:  Normal  Gynecologic exam: Deferred   Assessment/Plan:  48 y.o. G2P2   1. Class 2 obesity due to excess calories without serious comorbidity with body mass index (BMI) of 38.0 to 38.9 in adult Patient has successfully lost weight after 2 months on phentermine.  Given her chronic hypertension with more difficulty controlling on phentermine, decision not to represcribed the phentermine.  Will continue on a low calorie/carb diet such as [employer].  May also try intermittent fasting.  I aerobic physical activities 5 times a week and weightlifting every 2 days.  Patient will follow-up for her annual gynecologic exam and at the same time will monitor her weight loss.  2. Essential hypertension, benign Continue on the same treatment.  Low-salt diet.  Other orders - Ferrous Gluconate (IRON 27 PO); Take by mouth. - triamterene-hydrochlorothiazide (MAXZIDE-25)  37.5-25 MG tablet; Take 1 tablet by mouth daily. - estradiol (VIVELLE-DOT) 0.1 MG/24HR patch; Place 1 patch (0.1 mg total) onto the skin 2 (two) times a week.  Counseling on above issues and coordination of care more than 50% for 25 minutes.  Princess Bruins MD, 10:37 AM 08/31/2018

## 2018-09-02 ENCOUNTER — Other Ambulatory Visit: Payer: Self-pay

## 2018-09-02 ENCOUNTER — Encounter: Payer: Self-pay | Admitting: Obstetrics & Gynecology

## 2018-09-02 ENCOUNTER — Ambulatory Visit (INDEPENDENT_AMBULATORY_CARE_PROVIDER_SITE_OTHER): Payer: BLUE CROSS/BLUE SHIELD | Admitting: Family Medicine

## 2018-09-02 ENCOUNTER — Encounter: Payer: Self-pay | Admitting: Family Medicine

## 2018-09-02 DIAGNOSIS — I10 Essential (primary) hypertension: Secondary | ICD-10-CM

## 2018-09-02 MED ORDER — FERROUS FUMARATE 324 (106 FE) MG PO TABS: 1.0000 | ORAL_TABLET | Freq: Every day | ORAL | 5 refills | Status: DC

## 2018-09-02 MED ORDER — CALCIUM-VITAMIN D 600-400 MG-UNIT PO TABS: 1.0000 | ORAL_TABLET | Freq: Two times a day (BID) | ORAL | 5 refills | Status: DC

## 2018-09-02 MED ORDER — TRIAMTERENE-HCTZ 37.5-25 MG PO TABS: 1.0000 | ORAL_TABLET | Freq: Every day | ORAL | 1 refills | Status: DC

## 2018-09-02 NOTE — Patient Instructions (Signed)
1. Class 2 obesity due to excess calories without serious comorbidity with body mass index (BMI) of 38.0 to 38.9 in adult Patient has successfully lost weight after 2 months on phentermine.  Given her chronic hypertension with more difficulty controlling on phentermine, decision not to represcribed the phentermine.  Will continue on a low calorie/carb diet such as [employer].  May also try intermittent fasting.  I aerobic physical activities 5 times a week and weightlifting every 2 days.  Patient will follow-up for her annual gynecologic exam and at the same time will monitor her weight loss.  2. Essential hypertension, benign Continue on the same treatment.  Low-salt diet.  Other orders - Ferrous Gluconate (IRON 27 PO); Take by mouth. - triamterene-hydrochlorothiazide (MAXZIDE-25) 37.5-25 MG tablet; Take 1 tablet by mouth daily. - estradiol (VIVELLE-DOT) 0.1 MG/24HR patch; Place 1 patch (0.1 mg total) onto the skin 2 (two) times a week.  Bethany Richardson, it was a pleasure seeing you today!

## 2018-09-02 NOTE — Progress Notes (Signed)
    Subjective:    Patient ID: Bethany Richardson, female    DOB: 11/13/1970, 48 y.o.   MRN: [id number]  CC: Hypertension   HPI: Bethany Richardson is a 48 y.o. female presenting for Hypertension    Follow-up of hypertension. Patient has no history of headache chest pain or shortness of breath or recent cough. Patient also denies symptoms of TIA such as numbness weakness lateralizing. Patient checks  blood pressure at home and has not had any elevated readings recently. Patient denies side effects from his medication. States taking it regularly.   Depression screen [facility] 2/9 11/12/2017 10/28/2017 09/08/2016 07/22/2016 07/03/2016  Decreased Interest 0 0 0 0 0  Down, Depressed, Hopeless 0 0 0 0 0  PHQ - 2 Score 0 0 0 0 0     Relevant past medical, surgical, family and social history reviewed and updated as indicated.  Interim medical history since our last visit reviewed. Allergies and medications reviewed and updated.  ROS:  Review of Systems  Constitutional: Negative.   HENT: Negative.   Eyes: Negative for visual disturbance.  Respiratory: Negative for shortness of breath.   Cardiovascular: Negative for chest pain.  Gastrointestinal: Negative for abdominal pain.  Musculoskeletal: Negative for arthralgias.     Social History   Tobacco Use  Smoking Status Former Smoker  . Packs/day: 2.00  . Years: 20.00  . Pack years: 40.00  . Types: Cigarettes  . Last attempt to quit: 01/31/2009  . Years since quitting: 9.5  Smokeless Tobacco Never Used       Objective:     Wt Readings from Last 3 Encounters:  08/31/18 245 lb (111.1 kg)  01/19/18 263 lb (119.3 kg)  01/13/18 256 lb (116.1 kg)     Exam deferred. Pt. Harboring due to COVID 19. Phone visit performed.   Assessment & Plan:    Bethany Richardson was seen today for hypertension.  Diagnoses and all orders for this visit:  Essential hypertension, benign  Other orders -     triamterene-hydrochlorothiazide (MAXZIDE-25) 37.5-25 MG  tablet; Take 1 tablet by mouth daily. -     Calcium Carb-Cholecalciferol (CALCIUM-VITAMIN D) 600-400 MG-UNIT TABS; Take 1 tablet by mouth 2 (two) times daily. On Sunday -     Ferrous Fumarate (HEMOCYTE) 324 (106 Fe) MG TABS tablet; Take 1 tablet (106 mg of iron total) by mouth daily.   Virtual Visit via telephone Note  I discussed the limitations, risks, security and privacy concerns of performing an evaluation and management service by telephone and the availability of in person appointments. The patient expressed understanding and agreed to proceed. Pt. Is at home. Dr. Livia Snellen is in his office.  Follow Up Instructions:   I discussed the assessment and treatment plan with the patient. The patient was provided an opportunity to ask questions and all were answered. The patient agreed with the plan and demonstrated an understanding of the instructions.   The patient was advised to call back or seek an in-person evaluation if the symptoms worsen or if the condition fails to improve as anticipated.  Visit started: 4:30 Call ended:  4:48 Total minutes including chart review and phone contact time: 22   Follow up plan: Return in about 6 months (around 03/04/2019).  Claretta Fraise, MD [facility] 09/02/2018, 7:11 PM

## 2018-12-05 ENCOUNTER — Telehealth: Payer: Self-pay | Admitting: *Deleted

## 2018-12-05 NOTE — Telephone Encounter (Addendum)
Patient called stating patches are not working still having emotional issues feeling tearful and patches are starting to burn her skin. She asked if pill form can be prescribed to take daily? Please advise

## 2018-12-06 MED ORDER — ESTRADIOL 1 MG PO TABS: 1.0000 mg | ORAL_TABLET | Freq: Every day | ORAL | 4 refills | Status: DC

## 2018-12-06 NOTE — Telephone Encounter (Signed)
Patient informed. Rx sent

## 2018-12-06 NOTE — Telephone Encounter (Signed)
Left message for pt to call.

## 2018-12-06 NOTE — Telephone Encounter (Signed)
Can change to Estradiol 1 mg/tab 1 tab PO daily.  Please screen for Depression.  Will need a visit with me or her Fam MD if symptoms of depression.

## 2018-12-07 ENCOUNTER — Encounter: Payer: BLUE CROSS/BLUE SHIELD | Admitting: Obstetrics & Gynecology

## 2018-12-28 ENCOUNTER — Other Ambulatory Visit: Payer: Self-pay | Admitting: Family Medicine

## 2018-12-28 DIAGNOSIS — R109 Unspecified abdominal pain: Secondary | ICD-10-CM

## 2019-01-05 ENCOUNTER — Ambulatory Visit: Payer: BLUE CROSS/BLUE SHIELD | Admitting: Orthopaedic Surgery

## 2019-01-27 ENCOUNTER — Ambulatory Visit (INDEPENDENT_AMBULATORY_CARE_PROVIDER_SITE_OTHER): Payer: BC Managed Care – PPO | Admitting: Family Medicine

## 2019-01-27 NOTE — Progress Notes (Signed)
Patient is better and does not want visit Caryl Pina, MD [facility] 01/27/2019, 3:52 PM

## 2019-02-07 ENCOUNTER — Encounter: Payer: Self-pay | Admitting: Gynecology

## 2019-02-16 ENCOUNTER — Other Ambulatory Visit: Payer: Self-pay | Admitting: Family Medicine

## 2019-04-26 ENCOUNTER — Other Ambulatory Visit: Payer: Self-pay | Admitting: Family Medicine

## 2019-05-08 ENCOUNTER — Ambulatory Visit: Payer: Self-pay | Admitting: Orthopedic Surgery

## 2019-05-17 ENCOUNTER — Other Ambulatory Visit: Payer: Self-pay | Admitting: Family Medicine

## 2019-05-17 DIAGNOSIS — Z1231 Encounter for screening mammogram for malignant neoplasm of breast: Secondary | ICD-10-CM

## 2019-06-01 ENCOUNTER — Ambulatory Visit: Payer: Self-pay | Admitting: Orthopedic Surgery

## 2019-06-08 ENCOUNTER — Ambulatory Visit: Payer: BC Managed Care – PPO

## 2019-10-19 ENCOUNTER — Telehealth: Payer: Self-pay

## 2019-10-19 NOTE — Telephone Encounter (Signed)
NOTES ON FILE FROM [facility] [phone], SENT REFERRAL TO SCHEDULING

## 2019-11-02 ENCOUNTER — Other Ambulatory Visit: Payer: Self-pay

## 2019-11-02 ENCOUNTER — Ambulatory Visit
Admission: RE | Admit: 2019-11-02 | Discharge: 2019-11-02 | Disposition: A | Discharge status: Home / Self Care | Payer: BC Managed Care – PPO | Source: Ambulatory Visit | Attending: Family Medicine | Admitting: Family Medicine

## 2019-11-02 ENCOUNTER — Encounter: Payer: Self-pay | Admitting: *Deleted

## 2019-11-02 DIAGNOSIS — Z1231 Encounter for screening mammogram for malignant neoplasm of breast: Secondary | ICD-10-CM

## 2019-11-02 NOTE — Progress Notes (Signed)
Notes faxed  [facility]

## 2019-11-15 ENCOUNTER — Ambulatory Visit: Payer: BC Managed Care – PPO | Admitting: Cardiology

## 2019-12-02 ENCOUNTER — Encounter: Payer: Self-pay | Admitting: Family Medicine

## 2019-12-14 DIAGNOSIS — F411 Generalized anxiety disorder: Secondary | ICD-10-CM | POA: Insufficient documentation

## 2019-12-28 ENCOUNTER — Encounter: Payer: BC Managed Care – PPO | Admitting: Dietician

## 2020-01-19 ENCOUNTER — Other Ambulatory Visit: Payer: Self-pay | Admitting: Family Medicine

## 2020-01-23 ENCOUNTER — Other Ambulatory Visit: Payer: Self-pay | Admitting: Family Medicine

## 2020-01-23 DIAGNOSIS — R162 Hepatomegaly with splenomegaly, not elsewhere classified: Secondary | ICD-10-CM

## 2020-01-23 DIAGNOSIS — K760 Fatty (change of) liver, not elsewhere classified: Secondary | ICD-10-CM

## 2020-02-02 ENCOUNTER — Ambulatory Visit
Admission: RE | Admit: 2020-02-02 | Discharge: 2020-02-02 | Disposition: A | Discharge status: Home / Self Care | Payer: BC Managed Care – PPO | Source: Ambulatory Visit | Attending: Family Medicine | Admitting: Family Medicine

## 2020-02-02 DIAGNOSIS — R162 Hepatomegaly with splenomegaly, not elsewhere classified: Secondary | ICD-10-CM

## 2020-02-02 DIAGNOSIS — K760 Fatty (change of) liver, not elsewhere classified: Secondary | ICD-10-CM

## 2020-02-13 ENCOUNTER — Ambulatory Visit: Payer: BC Managed Care – PPO | Admitting: Dietician

## 2020-03-26 ENCOUNTER — Encounter: Payer: Self-pay | Admitting: Obstetrics & Gynecology

## 2020-12-05 ENCOUNTER — Encounter: Payer: Self-pay | Admitting: Family Medicine

## 2020-12-05 ENCOUNTER — Ambulatory Visit (INDEPENDENT_AMBULATORY_CARE_PROVIDER_SITE_OTHER): Payer: 59 | Admitting: Family Medicine

## 2020-12-05 DIAGNOSIS — J069 Acute upper respiratory infection, unspecified: Secondary | ICD-10-CM

## 2020-12-05 MED ORDER — BENZONATATE 100 MG PO CAPS
100.0000 mg | ORAL_CAPSULE | Freq: Three times a day (TID) | ORAL | 0 refills | Status: DC | PRN
Start: 2020-12-05 — End: 2020-12-20

## 2020-12-05 MED ORDER — FLUTICASONE PROPIONATE 50 MCG/ACT NA SUSP: 2.0000 | Freq: Every day | NASAL | 6 refills | Status: DC

## 2020-12-05 NOTE — Progress Notes (Signed)
   Virtual Visit  Note Due to COVID-19 pandemic this visit was conducted virtually. This visit type was conducted due to national recommendations for restrictions regarding the COVID-19 Pandemic (e.g. social distancing, sheltering in place) in an effort to limit this patient's exposure and mitigate transmission in our community. All issues noted in this document were discussed and addressed.  A physical exam was not performed with this format.  I connected with [name] on 12/05/20 at 1356 by telephone and verified that I am speaking with the correct person using two identifiers. Bethany Richardson is currently located at home and no one is currently with her during the visit. The provider, Gwenlyn Perking, FNP is located in their office at time of visit.  I discussed the limitations, risks, security and privacy concerns of performing an evaluation and management service by telephone and the availability of in person appointments. I also discussed with the patient that there may be a patient responsible charge related to this service. The patient expressed understanding and agreed to proceed.  CC: congestion  History and Present Illness:  HPI Kamaile reports nasal congestion x 2 days. She also reports sinus drainage, cough with yellow/green mucus, ear congestion, and nausea. Highest temp has been 99.9. She denies body aches, chills, vomiting, diarrhea, chest pain, or shortness of breath. Denies known exposure to Covid. She has been vaccinated, has not had a booster. She has taken aspirin, dayquil and nyquil for her symptoms.     ROS As per HPI  Observations/Objective: Alert and oriented x 3. Able to speak in full sentences without difficulty.    Assessment and Plan: Daneka was seen today for nasal congestion.  Diagnoses and all orders for this visit:  Upper respiratory tract infection, unspecified type Declined testing for Covid today. Discussed likely viral etiology. Flonase and  tessalon perles ordered. Continued OTC decongestants. Nasaline saline, rest, fluids. Return to office for new or worsening symptoms, or if symptoms persist.  -     fluticasone (FLONASE) 50 MCG/ACT nasal spray; Place 2 sprays into both nostrils daily. -     benzonatate (TESSALON PERLES) 100 MG capsule; Take 1 capsule (100 mg total) by mouth 3 (three) times daily as needed for cough.    Follow Up Instructions: As needed     I discussed the assessment and treatment plan with the patient. The patient was provided an opportunity to ask questions and all were answered. The patient agreed with the plan and demonstrated an understanding of the instructions.   The patient was advised to call back or seek an in-person evaluation if the symptoms worsen or if the condition fails to improve as anticipated.  The above assessment and management plan was discussed with the patient. The patient verbalized understanding of and has agreed to the management plan. Patient is aware to call the clinic if symptoms persist or worsen. Patient is aware when to return to the clinic for a follow-up visit. Patient educated on when it is appropriate to go to the emergency department.   Time call ended:  1410  I provided 14 minutes of  non face-to-face time during this encounter.    Gwenlyn Perking, FNP

## 2020-12-20 ENCOUNTER — Encounter: Payer: Self-pay | Admitting: Family Medicine

## 2020-12-20 ENCOUNTER — Ambulatory Visit (INDEPENDENT_AMBULATORY_CARE_PROVIDER_SITE_OTHER): Payer: 59 | Admitting: Family Medicine

## 2020-12-20 ENCOUNTER — Other Ambulatory Visit: Payer: Self-pay

## 2020-12-20 VITALS — BP 146/87 | HR 81 | Temp 98.1°F | Ht 66.5 in | Wt 256.8 lb

## 2020-12-20 DIAGNOSIS — J069 Acute upper respiratory infection, unspecified: Secondary | ICD-10-CM

## 2020-12-20 DIAGNOSIS — Z780 Asymptomatic menopausal state: Secondary | ICD-10-CM | POA: Diagnosis not present

## 2020-12-20 DIAGNOSIS — J301 Allergic rhinitis due to pollen: Secondary | ICD-10-CM

## 2020-12-20 MED ORDER — FEXOFENADINE-PSEUDOEPHED ER 180-240 MG PO TB24: 1.0000 | ORAL_TABLET | Freq: Every evening | ORAL | 11 refills | Status: AC

## 2020-12-20 MED ORDER — BENZONATATE 100 MG PO CAPS: 100.0000 mg | ORAL_CAPSULE | Freq: Three times a day (TID) | ORAL | 0 refills | Status: DC | PRN

## 2020-12-20 MED ORDER — ESTRADIOL 1 MG PO TABS: 1.5000 mg | ORAL_TABLET | Freq: Every day | ORAL | 3 refills | Status: DC

## 2020-12-20 MED ORDER — AMLODIPINE BESYLATE 5 MG PO TABS: 5.0000 mg | ORAL_TABLET | Freq: Every day | ORAL | 5 refills | Status: DC

## 2020-12-20 MED ORDER — DESVENLAFAXINE SUCCINATE ER 50 MG PO TB24
50.0000 mg | ORAL_TABLET | Freq: Every day | ORAL | 3 refills | Status: DC
Start: 2020-12-20 — End: 2021-01-28

## 2020-12-20 NOTE — Progress Notes (Signed)
Subjective:  Patient ID: Bethany Richardson, female    DOB: 02/11/1971  Age: 50 y.o. MRN: [id number]  CC: Medication Refill   HPI ROBERTA ANGELL presents for  follow-up of hypertension. Patient has no history of headache chest pain or shortness of breath or recent cough. Patient also denies symptoms of TIA such as focal numbness or weakness. Patient denies side effects from medication. States taking it regularly.  Pt. Using estradiol for menopausal depression issues. Had hyst 2-3 years ago.   History Placida has a past medical history of Adenomyosis, ASCUS of cervix with negative high risk HPV (11/2017), Chronic bronchitis ([place]), Depression, GERD (gastroesophageal reflux disease), Hypertension, Menorrhagia, Obesity, and Pneumonia (2013).   She has a past surgical history that includes Tubal ligation (Bilateral, 1995); Hysteroscopy with novasure (N/A, 10/19/2014); Robotic assisted total hysterectomy with bilateral salpingo oophorectomy (Bilateral, 01/19/2018); and Breast biopsy (Right, 11/23/2017).   Her family history includes Hypertension in her father and mother.She reports that she quit smoking about 11 years ago. Her smoking use included cigarettes. She has a 40.00 pack-year smoking history. She has never used smokeless tobacco. She reports current alcohol use. She reports that she does not use drugs.  No current outpatient medications on file prior to visit.   No current facility-administered medications on file prior to visit.    ROS Review of Systems  Constitutional: Negative.   HENT:  Positive for congestion, hearing loss (right ear congested) and postnasal drip.   Eyes:  Negative for visual disturbance.  Respiratory:  Positive for cough (coughs up a lot of congestion, gross and thick to yellow when she showers.). Negative for shortness of breath.   Cardiovascular:  Negative for chest pain.  Gastrointestinal:  Negative for abdominal pain.  Musculoskeletal:  Negative for  arthralgias.   Objective:  BP (!) 146/87   Pulse 81   Temp 98.1 F (36.7 C)   Ht 5' 6.5" (1.689 m)   Wt 256 lb 12.8 oz (116.5 kg)   LMP 01/13/2018 (Exact Date)   SpO2 96%   BMI 40.83 kg/m   BP Readings from Last 3 Encounters:  12/20/20 (!) 146/87  08/31/18 (!) 148/86  04/06/18 136/88    Wt Readings from Last 3 Encounters:  12/20/20 256 lb 12.8 oz (116.5 kg)  08/31/18 245 lb (111.1 kg)  01/19/18 263 lb (119.3 kg)     Physical Exam Constitutional:      General: She is not in acute distress.    Appearance: She is well-developed.  HENT:     Head: Normocephalic.     Right Ear: Ear canal normal.     Left Ear: Tympanic membrane and ear canal normal.     Nose: Congestion present.     Mouth/Throat:     Pharynx: Oropharyngeal exudate (posterior sinus drainage erythema) present.  Cardiovascular:     Rate and Rhythm: Normal rate and regular rhythm.  Pulmonary:     Breath sounds: Normal breath sounds.  Musculoskeletal:        General: Normal range of motion.  Skin:    General: Skin is warm and dry.  Neurological:     Mental Status: She is alert and oriented to person, place, and time.  Psychiatric:        Mood and Affect: Mood normal.      Assessment & Plan:   Breean was seen today for medication refill.  Diagnoses and all orders for this visit:  Post-menopause -     MM 3D SCREEN  BREAST BILATERAL; Future  Upper respiratory tract infection, unspecified type -     benzonatate (TESSALON PERLES) 100 MG capsule; Take 1 capsule (100 mg total) by mouth 3 (three) times daily as needed for cough.  Seasonal allergic rhinitis due to pollen  Other orders -     estradiol (ESTRACE) 1 MG tablet; Take 1.5 tablets (1.5 mg total) by mouth daily. -     fexofenadine-pseudoephedrine (ALLEGRA-D 24) 180-240 MG 24 hr tablet; Take 1 tablet by mouth every evening. For allergy and congestion -     desvenlafaxine (PRISTIQ) 50 MG 24 hr tablet; Take 1 tablet (50 mg total) by mouth  daily. -     amLODipine (NORVASC) 5 MG tablet; Take 1 tablet (5 mg total) by mouth daily. For blood pressure  Allergies as of 12/20/2020       Reactions   Zoloft [sertraline Hcl] Other (See Comments)   Suicide attempt.   Ace Inhibitors Cough        Medication List        Accurate as of December 20, 2020  2:45 PM. If you have any questions, ask your nurse or doctor.          STOP taking these medications    acetaminophen 500 MG tablet Commonly known as: TYLENOL Stopped by: Claretta Fraise, MD   busPIRone 5 MG tablet Commonly known as: BUSPAR Stopped by: Claretta Fraise, MD   Calcium-Vitamin D 600-400 MG-UNIT Tabs Stopped by: Claretta Fraise, MD   Ferrous Fumarate 324 (106 Fe) MG Tabs tablet Commonly known as: Hemocyte Stopped by: Claretta Fraise, MD   fluticasone 50 MCG/ACT nasal spray Commonly known as: FLONASE Stopped by: Claretta Fraise, MD   triamterene-hydrochlorothiazide 37.5-25 MG tablet Commonly known as: MAXZIDE-25 Stopped by: Claretta Fraise, MD       TAKE these medications    amLODipine 5 MG tablet Commonly known as: NORVASC Take 1 tablet (5 mg total) by mouth daily. For blood pressure Started by: Claretta Fraise, MD   benzonatate 100 MG capsule Commonly known as: Best boy Take 1 capsule (100 mg total) by mouth 3 (three) times daily as needed for cough.   desvenlafaxine 50 MG 24 hr tablet Commonly known as: Pristiq Take 1 tablet (50 mg total) by mouth daily. Started by: Claretta Fraise, MD   estradiol 1 MG tablet Commonly known as: ESTRACE Take 1.5 tablets (1.5 mg total) by mouth daily.   fexofenadine-pseudoephedrine 180-240 MG 24 hr tablet Commonly known as: ALLEGRA-D 24 Take 1 tablet by mouth every evening. For allergy and congestion Started by: Claretta Fraise, MD        Meds ordered this encounter  Medications   estradiol (ESTRACE) 1 MG tablet    Sig: Take 1.5 tablets (1.5 mg total) by mouth daily.    Dispense:  135 tablet    Refill:   3   fexofenadine-pseudoephedrine (ALLEGRA-D 24) 180-240 MG 24 hr tablet    Sig: Take 1 tablet by mouth every evening. For allergy and congestion    Dispense:  30 tablet    Refill:  11   desvenlafaxine (PRISTIQ) 50 MG 24 hr tablet    Sig: Take 1 tablet (50 mg total) by mouth daily.    Dispense:  90 tablet    Refill:  3   amLODipine (NORVASC) 5 MG tablet    Sig: Take 1 tablet (5 mg total) by mouth daily. For blood pressure    Dispense:  30 tablet    Refill:  5  benzonatate (TESSALON PERLES) 100 MG capsule    Sig: Take 1 capsule (100 mg total) by mouth 3 (three) times daily as needed for cough.    Dispense:  20 capsule    Refill:  0      Follow-up: Return in about 1 month (around 01/20/2021).  Claretta Fraise, M.D.

## 2021-01-08 ENCOUNTER — Encounter: Payer: Self-pay | Admitting: Family Medicine

## 2021-01-21 ENCOUNTER — Ambulatory Visit: Payer: 59 | Admitting: Family Medicine

## 2021-01-23 ENCOUNTER — Encounter: Payer: Self-pay | Admitting: Family Medicine

## 2021-01-27 ENCOUNTER — Encounter: Payer: Self-pay | Admitting: Family

## 2021-01-27 ENCOUNTER — Ambulatory Visit (INDEPENDENT_AMBULATORY_CARE_PROVIDER_SITE_OTHER): Payer: 59 | Admitting: Family

## 2021-01-27 DIAGNOSIS — J069 Acute upper respiratory infection, unspecified: Secondary | ICD-10-CM

## 2021-01-27 MED ORDER — PREDNISONE 10 MG (21) PO TBPK: ORAL_TABLET | ORAL | 0 refills | Status: DC

## 2021-01-27 MED ORDER — ALBUTEROL SULFATE HFA 108 (90 BASE) MCG/ACT IN AERS: 2.0000 | INHALATION_SPRAY | Freq: Four times a day (QID) | RESPIRATORY_TRACT | 0 refills | Status: DC | PRN

## 2021-01-27 NOTE — Progress Notes (Signed)
Virtual Visit  Note Due to COVID-19 pandemic this visit was conducted virtually. This visit type was conducted due to national recommendations for restrictions regarding the COVID-19 Pandemic (e.g. social distancing, sheltering in place) in an effort to limit this patient's exposure and mitigate transmission in our community. All issues noted in this document were discussed and addressed.  A physical exam was not performed with this format.  I connected with Bethany Richardson on 01/27/21 at 11:51 AM  by telephone and verified that I am speaking with the correct person using two identifiers. Bethany Richardson is currently located at home and her husband is currently with her during visit. The provider, Evelina Dun, FNP is located in their office at time of visit.  I discussed the limitations, risks, security and privacy concerns of performing an evaluation and management service by telephone and the availability of in person appointments. I also discussed with the patient that there may be a patient responsible charge related to this service. The patient expressed understanding and agreed to proceed.  Bethany Richardson, Bethany Richardson are scheduled for a virtual visit with your provider today.    Just as we do with appointments in the office, we must obtain your consent to participate.  Your consent will be active for this visit and any virtual visit you may have with one of our providers in the next 365 days.    If you have a MyChart account, I can also send a copy of this consent to you electronically.  All virtual visits are billed to your insurance company just like a traditional visit in the office.  As this is a virtual visit, video technology does not allow for your provider to perform a traditional examination.  This may limit your provider's ability to fully assess your condition.  If your provider identifies any concerns that need to be evaluated in person or the need to arrange testing such as labs, EKG, etc, we  will make arrangements to do so.    Although advances in technology are sophisticated, we cannot ensure that it will always work on either your end or our end.  If the connection with a video visit is poor, we may have to switch to a telephone visit.  With either a video or telephone visit, we are not always able to ensure that we have a secure connection.   I need to obtain your verbal consent now.   Are you willing to proceed with your visit today?   Bethany Richardson has provided verbal consent on 01/27/2021 for a virtual visit (video or telephone).   Evelina Dun, [place] 01/27/2021  11:52 AM     History and Present Illness:  Pt calls the office today with hoarse voice that started yesterday. She has not taken COVID test.  Cough This is a new problem. The current episode started yesterday. The problem has been gradually worsening. The problem occurs every few minutes. The cough is Productive of sputum. Associated symptoms include chills, a fever, headaches, myalgias, nasal congestion, a sore throat and wheezing. Pertinent negatives include no ear pain or shortness of breath. She has tried rest (allegra, tylenol) for the symptoms. The treatment provided mild relief.     Review of Systems  Constitutional:  Positive for chills and fever.  HENT:  Positive for sore throat. Negative for ear pain.   Respiratory:  Positive for cough and wheezing. Negative for shortness of breath.   Musculoskeletal:  Positive for myalgias.  Neurological:  Positive  for headaches.    Observations/Objective: No SOB or distress noted, hoarse voice, tight barking cough  Assessment and Plan: 1. Viral URI Rest Force fluids COVID test pending  Work note given Mucinex as needed  - Novel Coronavirus, NAA ([facility]) - predniSONE (STERAPRED UNI-PAK 21 TAB) 10 MG (21) TBPK tablet; Use as directed  Dispense: 21 tablet; Refill: 0 - albuterol (VENTOLIN HFA) 108 (90 Base) MCG/ACT inhaler; Inhale 2 puffs into the lungs  every 6 (six) hours as needed for wheezing or shortness of breath.  Dispense: 8 g; Refill: 0     I discussed the assessment and treatment plan with the patient. The patient was provided an opportunity to ask questions and all were answered. The patient agreed with the plan and demonstrated an understanding of the instructions.   The patient was advised to call back or seek an in-person evaluation if the symptoms worsen or if the condition fails to improve as anticipated.  The above assessment and management plan was discussed with the patient. The patient verbalized understanding of and has agreed to the management plan. Patient is aware to call the clinic if symptoms persist or worsen. Patient is aware when to return to the clinic for a follow-up visit. Patient educated on when it is appropriate to go to the emergency department.   Time call ended:  12:03 pm   I provided 12 minutes of  non face-to-face time during this encounter.    Evelina Dun, FNP

## 2021-01-28 ENCOUNTER — Other Ambulatory Visit: Payer: Self-pay | Admitting: Family

## 2021-01-28 LAB — NOVEL CORONAVIRUS, NAA: SARS-CoV-2, NAA: NOT DETECTED

## 2021-01-28 LAB — SARS-COV-2, NAA 2 DAY TAT

## 2021-01-29 ENCOUNTER — Ambulatory Visit (INDEPENDENT_AMBULATORY_CARE_PROVIDER_SITE_OTHER): Payer: 59 | Admitting: Nurse Practitioner

## 2021-01-29 ENCOUNTER — Encounter: Payer: Self-pay | Admitting: Nurse Practitioner

## 2021-01-29 DIAGNOSIS — H1033 Unspecified acute conjunctivitis, bilateral: Secondary | ICD-10-CM

## 2021-01-29 DIAGNOSIS — J029 Acute pharyngitis, unspecified: Secondary | ICD-10-CM

## 2021-01-29 MED ORDER — BACITRACIN-POLYMYXIN B 500-10000 UNIT/GM OP OINT: 1.0000 "application " | TOPICAL_OINTMENT | Freq: Two times a day (BID) | OPHTHALMIC | 0 refills | Status: DC

## 2021-01-29 MED ORDER — AMOXICILLIN-POT CLAVULANATE 875-125 MG PO TABS: 1.0000 | ORAL_TABLET | Freq: Two times a day (BID) | ORAL | 0 refills | Status: DC

## 2021-01-29 NOTE — Assessment & Plan Note (Signed)
  Unresolved worsening COVID-19 symptoms, cough, pharyngitis, congestions and conjunctivitis with eye discharge.  Take meds as prescribed - Use a cool mist humidifier  -Use saline nose sprays frequently -Force fluids -Augmentin 875-25 mg tablet by mouth, polysporin  ophthalmic ointment. -For fever or aches or pains- take Tylenol or ibuprofen. -If symptoms do not improve, may need to be reevaluated in OV Follow up with worsening unresolved symptoms

## 2021-01-29 NOTE — Progress Notes (Signed)
   Virtual Visit  Note Due to COVID-19 pandemic this visit was conducted virtually. This visit type was conducted due to national recommendations for restrictions regarding the COVID-19 Pandemic (e.g. social distancing, sheltering in place) in an effort to limit this patient's exposure and mitigate transmission in our community. All issues noted in this document were discussed and addressed.  A physical exam was not performed with this format.  I connected with Bethany Richardson on 01/29/21 at 11:00 am by telephone and verified that I am speaking with the correct person using two identifiers. Bethany Richardson is currently located at home during visit. The provider, Ivy Lynn, NP is located in their office at time of visit.  I discussed the limitations, risks, security and privacy concerns of performing an evaluation and management service by telephone and the availability of in person appointments. I also discussed with the patient that there may be a patient responsible charge related to this service. The patient expressed understanding and agreed to proceed.   History and Present Illness:  Sore Throat  This is a new problem. The problem has been gradually worsening. Neither side of throat is experiencing more pain than the other. The pain is moderate. Associated symptoms include congestion and swollen glands. Pertinent negatives include no headaches.  Conjunctivitis  The current episode started yesterday. The onset was sudden. The problem has been gradually worsening. Nothing relieves the symptoms. Nothing aggravates the symptoms. Associated symptoms include decreased vision, eye itching, congestion, swollen glands, eye discharge and eye redness. Pertinent negatives include no fever and no headaches. The eye pain is moderate. The eyelid exhibits redness.     Review of Systems  Constitutional:  Negative for chills and fever.  HENT:  Positive for congestion.   Eyes:  Positive for discharge,  redness and itching.  Neurological:  Negative for headaches.  All other systems reviewed and are negative.   Observations/Objective: Televisit patient not in distress.  Assessment and Plan:  Unresolved worsening COVID-19 symptoms, cough, pharyngitis, congestions and conjunctivitis with eye discharge.  Take meds as prescribed - Use a cool mist humidifier  -Use saline nose sprays frequently -Force fluids -Augmentin 875-25 mg tablet by mouth, polysporin  ophthalmic ointment. -For fever or aches or pains- take Tylenol or ibuprofen. -If symptoms do not improve, may need to be reevaluated in OV Follow up with worsening unresolved symptoms   Follow Up Instructions: Follow-up with worsening unresolved symptoms.  Grateful.    I discussed the assessment and treatment plan with the patient. The patient was provided an opportunity to ask questions and all were answered. The patient agreed with the plan and demonstrated an understanding of the instructions.   The patient was advised to call back or seek an in-person evaluation if the symptoms worsen or if the condition fails to improve as anticipated.  The above assessment and management plan was discussed with the patient. The patient verbalized understanding of and has agreed to the management plan. Patient is aware to call the clinic if symptoms persist or worsen. Patient is aware when to return to the clinic for a follow-up visit. Patient educated on when it is appropriate to go to the emergency department.   Time call ended: 11:11 am  I provided 11 minutes of  non face-to-face time during this encounter.    Ivy Lynn, NP

## 2021-02-02 ENCOUNTER — Encounter: Payer: Self-pay | Admitting: Family Medicine

## 2021-02-03 ENCOUNTER — Encounter: Payer: Self-pay | Admitting: Nurse Practitioner

## 2021-02-03 ENCOUNTER — Ambulatory Visit (INDEPENDENT_AMBULATORY_CARE_PROVIDER_SITE_OTHER): Payer: 59 | Admitting: Nurse Practitioner

## 2021-02-03 DIAGNOSIS — R059 Cough, unspecified: Secondary | ICD-10-CM

## 2021-02-03 MED ORDER — DM-GUAIFENESIN ER 30-600 MG PO TB12: 1.0000 | ORAL_TABLET | Freq: Two times a day (BID) | ORAL | 0 refills | Status: DC

## 2021-02-03 NOTE — Progress Notes (Signed)
   Virtual Visit  Note Due to COVID-19 pandemic this visit was conducted virtually. This visit type was conducted due to national recommendations for restrictions regarding the COVID-19 Pandemic (e.g. social distancing, sheltering in place) in an effort to limit this patient's exposure and mitigate transmission in our community. All issues noted in this document were discussed and addressed.  A physical exam was not performed with this format.  I connected with Bethany Richardson on 02/03/21 at 12:20 pm  by telephone and verified that I am speaking with the correct person using two identifiers. Bethany Richardson is currently located at home during visit. The provider, Ivy Lynn, NP is located in their office at time of visit.  I discussed the limitations, risks, security and privacy concerns of performing an evaluation and management service by telephone and the availability of in person appointments. I also discussed with the patient that there may be a patient responsible charge related to this service. The patient expressed understanding and agreed to proceed.   History and Present Illness:  Cough This is a new problem. The current episode started yesterday. The problem has been unchanged. The problem occurs constantly. The cough is Productive of sputum. Associated symptoms include nasal congestion. Pertinent negatives include no chest pain, chills, ear congestion, headaches, rash or shortness of breath. Nothing aggravates the symptoms.     Review of Systems  Constitutional:  Negative for chills.  Respiratory:  Positive for cough. Negative for shortness of breath.   Cardiovascular:  Negative for chest pain.  Skin:  Negative for rash.  Neurological:  Negative for headaches.  All other systems reviewed and are negative.   Observations/Objective: Televisit patient not in distress  Assessment and Plan: Guaifenesin (Mucinex )30-600 mg  every 12 hrs for cough and congestion.  Follow Up  Instructions: Follow-up with worsening unresolved symptoms.    I discussed the assessment and treatment plan with the patient. The patient was provided an opportunity to ask questions and all were answered. The patient agreed with the plan and demonstrated an understanding of the instructions.   The patient was advised to call back or seek an in-person evaluation if the symptoms worsen or if the condition fails to improve as anticipated.  The above assessment and management plan was discussed with the patient. The patient verbalized understanding of and has agreed to the management plan. Patient is aware to call the clinic if symptoms persist or worsen. Patient is aware when to return to the clinic for a follow-up visit. Patient educated on when it is appropriate to go to the emergency department.   Time call ended: 12:30 PM  I provided 10 minutes of  non face-to-face time during this encounter.    Ivy Lynn, NP

## 2021-02-03 NOTE — Assessment & Plan Note (Signed)
Guaifenesin (Mucinex )30-600 mg  every 12 hrs for cough and congestion

## 2021-02-03 NOTE — Telephone Encounter (Signed)
Office visit with you 09/14 please advise

## 2021-02-07 ENCOUNTER — Encounter: Payer: Self-pay | Admitting: Nurse Practitioner

## 2021-02-07 ENCOUNTER — Ambulatory Visit (INDEPENDENT_AMBULATORY_CARE_PROVIDER_SITE_OTHER): Payer: 59 | Admitting: Family Medicine

## 2021-02-07 DIAGNOSIS — R0982 Postnasal drip: Secondary | ICD-10-CM | POA: Diagnosis not present

## 2021-02-07 DIAGNOSIS — R058 Other specified cough: Secondary | ICD-10-CM | POA: Diagnosis not present

## 2021-02-07 DIAGNOSIS — R490 Dysphonia: Secondary | ICD-10-CM | POA: Diagnosis not present

## 2021-02-07 NOTE — Progress Notes (Signed)
   Virtual Visit  Note Due to COVID-19 pandemic this visit was conducted virtually. This visit type was conducted due to national recommendations for restrictions regarding the COVID-19 Pandemic (e.g. social distancing, sheltering in place) in an effort to limit this patient's exposure and mitigate transmission in our community. All issues noted in this document were discussed and addressed.  A physical exam was not performed with this format.  I connected with Bethany Richardson on 02/07/21 at 1607 by telephone and verified that I am speaking with the correct person using two identifiers. Bethany Richardson is currently located at home and no one is currently with her during the visit. The provider, Gwenlyn Perking, FNP is located in their office at time of visit.  I discussed the limitations, risks, security and privacy concerns of performing an evaluation and management service by telephone and the availability of in person appointments. I also discussed with the patient that there may be a patient responsible charge related to this service. The patient expressed understanding and agreed to proceed.  CC: hoarseness  History and Present Illness:  HPI Bethany Richardson reports cough, sinus drainage, and hoarseness for 3 weeks. She was given a prednisone taper on 9/12 and started on augmentin on 9/14. She has had a negative Covid test during this. She denies fever, sore throat, swollen or red tonsils, exudate, ear pain, chills, chest pain, or shortness of breath. She has been taken delsym and mucinex DM with some improvement. She was previously taking allergy medications (nasacort and allegra-D) but stopped this when she was started on other medications for her symptoms.     ROS As per HPI.   Observations/Objective: Alert and oriented x 3. Able to speak in full sentences without difficulty. Hoarseness noted in voice.   Assessment and Plan: Bethany Richardson was seen today for hoarse.  Diagnoses and all orders for  this visit:  Post-viral cough syndrome Postnasal drip Hoarseness X3 weeks. Has been treated with prednisone taper and augmentin without improvement. Has had negative Covid test. Discussed restarting allegra and Nasacort for cough and postnasal drip. Discussed symptomatic care.   Follow Up Instructions: Return to office for new or worsening symptoms, or if symptoms persist.     I discussed the assessment and treatment plan with the patient. The patient was provided an opportunity to ask questions and all were answered. The patient agreed with the plan and demonstrated an understanding of the instructions.   The patient was advised to call back or seek an in-person evaluation if the symptoms worsen or if the condition fails to improve as anticipated.  The above assessment and management plan was discussed with the patient. The patient verbalized understanding of and has agreed to the management plan. Patient is aware to call the clinic if symptoms persist or worsen. Patient is aware when to return to the clinic for a follow-up visit. Patient educated on when it is appropriate to go to the emergency department.   Time call ended:  1620  I provided 13 minutes of  non face-to-face time during this encounter.    Gwenlyn Perking, FNP

## 2021-02-10 ENCOUNTER — Encounter: Payer: Self-pay | Admitting: Family Medicine

## 2021-02-10 ENCOUNTER — Ambulatory Visit: Payer: 59 | Admitting: Family Medicine

## 2021-02-11 ENCOUNTER — Encounter: Payer: Self-pay | Admitting: Family Medicine

## 2021-02-12 ENCOUNTER — Inpatient Hospital Stay: Admission: RE | Admit: 2021-02-12 | Payer: BC Managed Care – PPO | Source: Ambulatory Visit

## 2021-02-17 ENCOUNTER — Encounter: Payer: Self-pay | Admitting: Family

## 2021-02-17 ENCOUNTER — Other Ambulatory Visit: Payer: Self-pay | Admitting: Family

## 2021-02-17 ENCOUNTER — Ambulatory Visit (INDEPENDENT_AMBULATORY_CARE_PROVIDER_SITE_OTHER): Payer: 59 | Admitting: Family

## 2021-02-17 DIAGNOSIS — J029 Acute pharyngitis, unspecified: Secondary | ICD-10-CM

## 2021-02-17 LAB — CULTURE, GROUP A STREP

## 2021-02-17 LAB — RAPID STREP SCREEN (MED CTR MEBANE ONLY): Strep Gp A Ag, IA W/Reflex: NEGATIVE

## 2021-02-17 MED ORDER — DOXYCYCLINE HYCLATE 100 MG PO TABS: 100.0000 mg | ORAL_TABLET | Freq: Two times a day (BID) | ORAL | 0 refills | Status: DC

## 2021-02-17 NOTE — Progress Notes (Signed)
Virtual Visit  Note Due to COVID-19 pandemic this visit was conducted virtually. This visit type was conducted due to national recommendations for restrictions regarding the COVID-19 Pandemic (e.g. social distancing, sheltering in place) in an effort to limit this patient's exposure and mitigate transmission in our community. All issues noted in this document were discussed and addressed.  A physical exam was not performed with this format.  I connected with Bethany Richardson on 02/17/21 at 12:25 pm  by telephone and verified that I am speaking with the correct person using two identifiers. Bethany Richardson is currently located at home and her husband is currently with her during visit. The provider, Evelina Dun, FNP is located in their office at time of visit.  I discussed the limitations, risks, security and privacy concerns of performing an evaluation and management service by telephone and the availability of in person appointments. I also discussed with the patient that there may be a patient responsible charge related to this service. The patient expressed understanding and agreed to proceed.   History and Present Illness:  Pt calls the office today with sore throat. She has been sick for three weeks with cough and sore throat. She has completed Augmentin, prednisone, tessalon, albuterol, and mucinex with mild relief. Her cough and "cold" has improved, but continues to have a sore throat.  Sore Throat  This is a new problem. The current episode started 1 to 4 weeks ago. The pain is worse on the right side. There has been no fever. The pain is at a severity of 8/10. The pain is moderate. Associated symptoms include congestion, coughing, ear pain, headaches, a hoarse voice, shortness of breath and trouble swallowing. Pertinent negatives include no swollen glands. She has tried acetaminophen for the symptoms. The treatment provided mild relief.     Review of Systems  HENT:  Positive for  congestion, ear pain, hoarse voice and trouble swallowing.   Respiratory:  Positive for cough and shortness of breath.   Neurological:  Positive for headaches.    Observations/Objective: No SOB or distress noted, hoarse voice   Assessment and Plan: 1. Sore throat Pt will come get tested for strep to rule out. Given she has already completed Augmentin I do not want to treat unless positive.  - Take meds as prescribed - Use a cool mist humidifier  -Use saline nose sprays frequently -Force fluids -For any cough or congestion  Use plain Mucinex- regular strength or max strength is fine -For fever or aces or pains- take tylenol or ibuprofen. -Throat lozenges if help - Rapid Strep Screen ([facility] ONLY)    I discussed the assessment and treatment plan with the patient. The patient was provided an opportunity to ask questions and all were answered. The patient agreed with the plan and demonstrated an understanding of the instructions.   The patient was advised to call back or seek an in-person evaluation if the symptoms worsen or if the condition fails to improve as anticipated.  The above assessment and management plan was discussed with the patient. The patient verbalized understanding of and has agreed to the management plan. Patient is aware to call the clinic if symptoms persist or worsen. Patient is aware when to return to the clinic for a follow-up visit. Patient educated on when it is appropriate to go to the emergency department.   Time call ended: 12:37 pm   I provided 12 minutes of  non face-to-face time during this encounter.  Evelina Dun, FNP

## 2021-02-19 ENCOUNTER — Telehealth: Payer: Self-pay

## 2021-02-19 ENCOUNTER — Other Ambulatory Visit: Payer: Self-pay

## 2021-02-19 DIAGNOSIS — Z1231 Encounter for screening mammogram for malignant neoplasm of breast: Secondary | ICD-10-CM

## 2021-02-19 NOTE — Telephone Encounter (Signed)
Called to schedule mammogram.   Patient states that referral for ENT was discussed but current medications are helping more than any have before - she will follow up after finishing the medication.  Patient will started on doxycycline on 02/17/2021.   Patient also states that she is due for endoscopy and colonoscopy and would like referrals for both. Please review and advise

## 2021-02-20 NOTE — Telephone Encounter (Signed)
This can wait for stacks return

## 2021-02-26 ENCOUNTER — Encounter: Payer: Self-pay | Admitting: Family Medicine

## 2021-02-28 ENCOUNTER — Other Ambulatory Visit: Payer: Self-pay | Admitting: Family Medicine

## 2021-02-28 DIAGNOSIS — K921 Melena: Secondary | ICD-10-CM

## 2021-02-28 DIAGNOSIS — K2100 Gastro-esophageal reflux disease with esophagitis, without bleeding: Secondary | ICD-10-CM

## 2021-03-05 ENCOUNTER — Encounter: Payer: Self-pay | Admitting: Family Medicine

## 2021-03-05 ENCOUNTER — Other Ambulatory Visit: Payer: Self-pay

## 2021-03-05 ENCOUNTER — Ambulatory Visit (INDEPENDENT_AMBULATORY_CARE_PROVIDER_SITE_OTHER): Payer: 59 | Admitting: Family Medicine

## 2021-03-05 VITALS — BP 165/83 | HR 85 | Temp 98.4°F | Ht 66.5 in | Wt 253.0 lb

## 2021-03-05 DIAGNOSIS — S2231XD Fracture of one rib, right side, subsequent encounter for fracture with routine healing: Secondary | ICD-10-CM | POA: Diagnosis not present

## 2021-03-05 NOTE — Progress Notes (Signed)
Subjective:  Patient ID: Bethany Richardson, female    DOB: [birth year], 50 y.o.   MRN: [id number]  Patient Care Team: Claretta Fraise, MD as PCP - General (Family Medicine)   Chief Complaint:  Rib Injury   HPI: Bethany Richardson is a 50 y.o. female presenting on 03/05/2021 for Rib Injury   Pt presents today for follow up after ED visit on 03/01/2021. She had a fall and went to ED. Lumbar spine and right shoulder imaging were unremarkable. Rib and chest imaging revealed an acute mildly displaced fractures of the posterior and lateral aspects of the right fourth rib, no pneumothorax. She was sent home with a muscle relaxant, oxycodone, and naproxen for pain management and an incentive spirometer. States she continues to have pain and is supposed to go back to work and does not feel she will be able to. She also reports almost being out of medications prescribed in the ED. She has not tried tylenol at home for pain management. She has not been using ice or heat. States she has sharp pain in her right rib area with deep breathing, cough, certain movements, and when supine. No shortness of breath, hemoptysis, sputum production, fever, chills, weakness, or confusion.   There are no diagnoses linked to this encounter.    Relevant past medical, surgical, family, and social history reviewed and updated as indicated.  Allergies and medications reviewed and updated. Data reviewed: Chart in Epic.   Past Medical History:  Diagnosis Date   Adenomyosis    ASCUS of cervix with negative high risk HPV 11/2017   Chronic bronchitis ([place])    Depression    GERD (gastroesophageal reflux disease)    Hypertension    Menorrhagia    Obesity    Pneumonia 2013    Past Surgical History:  Procedure Laterality Date   BREAST BIOPSY Right 11/23/2017   [place]   HYSTEROSCOPY WITH NOVASURE N/A 10/19/2014   Procedure: HYSTEROSCOPY, ENDOMETRIAL ABLATION;  Surgeon: Florian Buff, MD;  Location: [facility];  Service:  Gynecology;  Laterality: N/A;  Uterine Cavity Length 5.0cm Uterine Cavity Width 4.5cm Power 124 Time 2 minutes   ROBOTIC ASSISTED TOTAL HYSTERECTOMY WITH BILATERAL SALPINGO OOPHERECTOMY Bilateral 01/19/2018   Procedure: XI ROBOTIC ASSISTED TOTAL HYSTERECTOMY WITH BILATERAL SALPINGO OOPHORECTOMY;  Surgeon: Princess Bruins, MD;  Location: [facility];  Service: Gynecology;  Laterality: Bilateral;   TUBAL LIGATION Bilateral 1995    Social History   Socioeconomic History   Marital status: Married    Spouse name: Not on file   Number of children: Not on file   Years of education: Not on file   Highest education level: Not on file  Occupational History   Not on file  Tobacco Use   Smoking status: Former    Packs/day: 2.00    Years: 20.00    Pack years: 40.00    Types: Cigarettes    Quit date: 01/31/2009    Years since quitting: 12.0   Smokeless tobacco: Never  Vaping Use   Vaping Use: Never used  Substance and Sexual Activity   Alcohol use: Yes    Comment: occassional   Drug use: No   Sexual activity: Not Currently    Partners: Male    Birth control/protection: Surgical    Comment: 1st intercourse 18 yo-1 partner-BTL  Other Topics Concern   Not on file  Social History Narrative   Not on file   Social Determinants of Health   Financial Resource Strain:  Not on file  Food Insecurity: Not on file  Transportation Needs: Not on file  Physical Activity: Not on file  Stress: Not on file  Social Connections: Not on file  Intimate Partner Violence: Not on file    Outpatient Encounter Medications as of 03/05/2021  Medication Sig   albuterol (VENTOLIN HFA) 108 (90 Base) MCG/ACT inhaler Inhale 2 puffs into the lungs every 6 (six) hours as needed for wheezing or shortness of breath.   amLODipine (NORVASC) 5 MG tablet Take 1 tablet (5 mg total) by mouth daily. For blood pressure   estradiol (ESTRACE) 1 MG tablet Take 1.5 tablets (1.5 mg total) by mouth daily.    fexofenadine-pseudoephedrine (ALLEGRA-D 24) 180-240 MG 24 hr tablet Take 1 tablet by mouth every evening. For allergy and congestion   methocarbamol (ROBAXIN) 500 MG tablet Take 500 mg by mouth 2 (two) times daily.   naproxen (NAPROSYN) 500 MG tablet Take 500 mg by mouth 2 (two) times daily as needed.   oxyCODONE-acetaminophen (PERCOCET/ROXICET) 5-325 MG tablet Take 1 tablet by mouth every 6 (six) hours as needed.   [DISCONTINUED] doxycycline (VIBRA-TABS) 100 MG tablet Take 1 tablet (100 mg total) by mouth 2 (two) times daily.   No facility-administered encounter medications on file as of 03/05/2021.    Allergies  Allergen Reactions   Zoloft [Sertraline Hcl] Other (See Comments)    Suicide attempt.   Ace Inhibitors Cough    Review of Systems  Constitutional:  Negative for activity change, appetite change, chills, fatigue and fever.  HENT: Negative.    Eyes: Negative.   Respiratory:  Negative for cough, chest tightness and shortness of breath.   Cardiovascular:  Negative for chest pain, palpitations and leg swelling.  Gastrointestinal:  Negative for blood in stool, constipation, diarrhea, nausea and vomiting.  Endocrine: Negative.   Genitourinary:  Negative for dysuria, frequency and urgency.  Musculoskeletal:  Positive for arthralgias (right rib) and myalgias (right chest wall).  Skin: Negative.   Allergic/Immunologic: Negative.   Neurological:  Negative for dizziness and headaches.  Hematological: Negative.   Psychiatric/Behavioral:  Negative for confusion, hallucinations, sleep disturbance and suicidal ideas.   All other systems reviewed and are negative.      Objective:  BP (!) 165/83   Pulse 85   Temp 98.4 F (36.9 C)   Ht 5' 6.5" (1.689 m)   Wt 253 lb (114.8 kg)   LMP 01/13/2018 (Exact Date)   SpO2 94%   BMI 40.22 kg/m    Wt Readings from Last 3 Encounters:  03/05/21 253 lb (114.8 kg)  12/20/20 256 lb 12.8 oz (116.5 kg)  08/31/18 245 lb (111.1 kg)    Physical  Exam Vitals and nursing note reviewed.  Constitutional:      General: She is not in acute distress.    Appearance: Normal appearance. She is well-developed and well-groomed. She is obese. She is not ill-appearing, toxic-appearing or diaphoretic.  HENT:     Head: Normocephalic and atraumatic.     Jaw: There is normal jaw occlusion.     Right Ear: Hearing normal.     Left Ear: Hearing normal.     Nose: Nose normal.     Mouth/Throat:     Lips: Pink.     Mouth: Mucous membranes are moist.     Pharynx: Oropharynx is clear. Uvula midline.  Eyes:     General: Lids are normal.     Extraocular Movements: Extraocular movements intact.     Conjunctiva/sclera: Conjunctivae normal.  Pupils: Pupils are equal, round, and reactive to light.  Neck:     Thyroid: No thyroid mass, thyromegaly or thyroid tenderness.     Vascular: No carotid bruit or JVD.     Trachea: Trachea and phonation normal.  Cardiovascular:     Rate and Rhythm: Normal rate and regular rhythm.     Chest Wall: PMI is not displaced.     Pulses: Normal pulses.     Heart sounds: Normal heart sounds. No murmur heard.   No friction rub. No gallop.  Pulmonary:     Effort: Pulmonary effort is normal. No respiratory distress.     Breath sounds: Normal breath sounds. No decreased breath sounds, wheezing or rhonchi.    Chest:     Chest wall: Tenderness (right lateral chest wall) present. No mass, lacerations, deformity or swelling.  Abdominal:     General: Bowel sounds are normal. There is no distension or abdominal bruit.     Palpations: Abdomen is soft. There is no hepatomegaly or splenomegaly.     Tenderness: There is no abdominal tenderness. There is no right CVA tenderness or left CVA tenderness.     Hernia: No hernia is present.  Musculoskeletal:        General: Normal range of motion.     Cervical back: Normal range of motion and neck supple.     Right lower leg: No edema.     Left lower leg: No edema.  Lymphadenopathy:      Cervical: No cervical adenopathy.  Skin:    General: Skin is warm and dry.     Capillary Refill: Capillary refill takes less than 2 seconds.     Coloration: Skin is not cyanotic, jaundiced or pale.     Findings: No rash.  Neurological:     General: No focal deficit present.     Mental Status: She is alert and oriented to person, place, and time.     Cranial Nerves: Cranial nerves are intact. No cranial nerve deficit.     Sensory: Sensation is intact. No sensory deficit.     Motor: Motor function is intact. No weakness.     Coordination: Coordination is intact. Coordination normal.     Gait: Gait is intact. Gait normal.     Deep Tendon Reflexes: Reflexes are normal and symmetric. Reflexes normal.  Psychiatric:        Attention and Perception: Attention and perception normal.        Mood and Affect: Mood and affect normal.        Speech: Speech normal.        Behavior: Behavior normal. Behavior is cooperative.        Thought Content: Thought content normal.        Cognition and Memory: Cognition and memory normal.        Judgment: Judgment normal.    Results for orders placed or performed in visit on 02/17/21  Rapid Strep Screen ([facility] ONLY)   Specimen: Other   Other  Result Value Ref Range   Strep Gp A Ag, IA W/Reflex Negative Negative  Culture, Group A Strep   Other  Result Value Ref Range   Strep A Culture CANCELED        Pertinent labs & imaging results that were available during my care of the patient were reviewed by me and considered in my medical decision making.  Assessment & Plan:  Gwenith was seen today for rib injury.  Diagnoses and all  orders for this visit:  Closed fracture of one rib of right side with routine healing, subsequent encounter Continue symptomatic care at home. Continue Naproxen as prescribed. Continue use of incentive spirometer. Report any new, worsening, or persistent symptoms. Work note extended, pt can return to work  03/10/2021.   Continue all other maintenance medications.  Follow up plan: Return in about 6 weeks (around 04/16/2021), or if symptoms worsen or fail to improve.   Continue healthy lifestyle choices, including diet (rich in fruits, vegetables, and lean proteins, and low in salt and simple carbohydrates) and exercise (at least 30 minutes of moderate physical activity daily).  Educational handout given for rib fracture  The above assessment and management plan was discussed with the patient. The patient verbalized understanding of and has agreed to the management plan. Patient is aware to call the clinic if they develop any new symptoms or if symptoms persist or worsen. Patient is aware when to return to the clinic for a follow-up visit. Patient educated on when it is appropriate to go to the emergency department.   Monia Pouch, FNP-C [facility] [phone]

## 2021-03-07 ENCOUNTER — Encounter: Payer: Self-pay | Admitting: Family Medicine

## 2021-03-11 ENCOUNTER — Encounter: Payer: Self-pay | Admitting: Family Medicine

## 2021-03-11 ENCOUNTER — Ambulatory Visit (INDEPENDENT_AMBULATORY_CARE_PROVIDER_SITE_OTHER): Payer: 59 | Admitting: Family Medicine

## 2021-03-11 ENCOUNTER — Other Ambulatory Visit: Payer: Self-pay

## 2021-03-11 VITALS — BP 154/79 | HR 82 | Temp 97.6°F | Ht 66.5 in | Wt 254.2 lb

## 2021-03-11 DIAGNOSIS — Z23 Encounter for immunization: Secondary | ICD-10-CM

## 2021-03-11 DIAGNOSIS — I10 Essential (primary) hypertension: Secondary | ICD-10-CM | POA: Diagnosis not present

## 2021-03-11 MED ORDER — AMLODIPINE BESYLATE 10 MG PO TABS: 10.0000 mg | ORAL_TABLET | Freq: Every day | ORAL | 3 refills | Status: AC

## 2021-03-11 NOTE — Progress Notes (Signed)
Subjective:  Patient ID: Bethany Richardson, female    DOB: 1971/05/02  Age: 50 y.o. MRN: [id number]  CC: Follow-up   HPI TECORA EUSTACHE presents for  follow-up of hypertension. Patient has no history of headache chest pain or shortness of breath or recent cough. Patient also denies symptoms of TIA such as focal numbness or weakness. Patient denies side effects from medication. States taking it regularly.Staying high in spite of BP med. Measures it qod at home.   Fractured rib, right upper chest.  This occurred on 16 October.  She had been back to work for couple weeks from a significant infection from which she had been off work for 2 weeks.  She subsequently missed a week of work due to the pain from the fractured rib.  Additionally she could not lift her right upper extremity in order to type onto her keyboard.  She resumed work yesterday and has been tolerating the work situation well.     History Bethany Richardson has a past medical history of Adenomyosis, ASCUS of cervix with negative high risk HPV (11/2017), Chronic bronchitis ([place]), Depression, GERD (gastroesophageal reflux disease), Hypertension, Menorrhagia, Obesity, and Pneumonia (2013).   She has a past surgical history that includes Tubal ligation (Bilateral, 1995); Hysteroscopy with novasure (N/A, 10/19/2014); Robotic assisted total hysterectomy with bilateral salpingo oophorectomy (Bilateral, 01/19/2018); and Breast biopsy (Right, 11/23/2017).   Her family history includes Hypertension in her father and mother.She reports that she quit smoking about 12 years ago. Her smoking use included cigarettes. She has a 40.00 pack-year smoking history. She has never used smokeless tobacco. She reports current alcohol use. She reports that she does not use drugs.  Current Outpatient Medications on File Prior to Visit  Medication Sig Dispense Refill   albuterol (VENTOLIN HFA) 108 (90 Base) MCG/ACT inhaler Inhale 2 puffs into the lungs every 6 (six) hours  as needed for wheezing or shortness of breath. 8 g 0   estradiol (ESTRACE) 1 MG tablet Take 1.5 tablets (1.5 mg total) by mouth daily. 135 tablet 3   fexofenadine-pseudoephedrine (ALLEGRA-D 24) 180-240 MG 24 hr tablet Take 1 tablet by mouth every evening. For allergy and congestion 30 tablet 11   methocarbamol (ROBAXIN) 500 MG tablet Take 500 mg by mouth 2 (two) times daily.     naproxen (NAPROSYN) 500 MG tablet Take 500 mg by mouth 2 (two) times daily as needed.     No current facility-administered medications on file prior to visit.    ROS Review of Systems  Constitutional: Negative.   HENT: Negative.    Eyes:  Negative for visual disturbance.  Respiratory:  Negative for shortness of breath.   Cardiovascular:  Negative for chest pain.  Gastrointestinal:  Negative for abdominal pain.  Musculoskeletal:  Negative for arthralgias.   Objective:  BP (!) 154/79   Pulse 82   Temp 97.6 F (36.4 C)   Ht 5' 6.5" (1.689 m)   Wt 254 lb 3.2 oz (115.3 kg)   LMP 01/13/2018 (Exact Date)   SpO2 98%   BMI 40.41 kg/m   BP Readings from Last 3 Encounters:  03/11/21 (!) 154/79  03/05/21 (!) 165/83  12/20/20 (!) 146/87    Wt Readings from Last 3 Encounters:  03/11/21 254 lb 3.2 oz (115.3 kg)  03/05/21 253 lb (114.8 kg)  12/20/20 256 lb 12.8 oz (116.5 kg)     Physical Exam Constitutional:      General: She is not in acute distress.  Appearance: She is well-developed.  Cardiovascular:     Rate and Rhythm: Normal rate and regular rhythm.  Pulmonary:     Breath sounds: Normal breath sounds.  Musculoskeletal:        General: Normal range of motion.  Skin:    General: Skin is warm and dry.  Neurological:     Mental Status: She is alert and oriented to person, place, and time.      Assessment & Plan:   Amazing was seen today for follow-up.  Diagnoses and all orders for this visit:  Need for immunization against influenza -     Flu Vaccine QUAD 72moIM (Fluarix, Fluzone &  Alfiuria Quad PF)  Other orders -     amLODipine (NORVASC) 10 MG tablet; Take 1 tablet (10 mg total) by mouth daily. For blood pressure  Allergies as of 03/11/2021       Reactions   Zoloft [sertraline Hcl] Other (See Comments)   Suicide attempt.   Ace Inhibitors Cough        Medication List        Accurate as of March 11, 2021  8:07 PM. If you have any questions, ask your nurse or doctor.          STOP taking these medications    oxyCODONE-acetaminophen 5-325 MG tablet Commonly known as: PERCOCET/ROXICET Stopped by: WClaretta Fraise MD       TAKE these medications    albuterol 108 (90 Base) MCG/ACT inhaler Commonly known as: VENTOLIN HFA Inhale 2 puffs into the lungs every 6 (six) hours as needed for wheezing or shortness of breath.   amLODipine 10 MG tablet Commonly known as: NORVASC Take 1 tablet (10 mg total) by mouth daily. For blood pressure What changed:  medication strength how much to take Changed by: WClaretta Fraise MD   estradiol 1 MG tablet Commonly known as: ESTRACE Take 1.5 tablets (1.5 mg total) by mouth daily.   fexofenadine-pseudoephedrine 180-240 MG 24 hr tablet Commonly known as: ALLEGRA-D 24 Take 1 tablet by mouth every evening. For allergy and congestion   methocarbamol 500 MG tablet Commonly known as: ROBAXIN Take 500 mg by mouth 2 (two) times daily.   naproxen 500 MG tablet Commonly known as: NAPROSYN Take 500 mg by mouth 2 (two) times daily as needed.        Meds ordered this encounter  Medications   amLODipine (NORVASC) 10 MG tablet    Sig: Take 1 tablet (10 mg total) by mouth daily. For blood pressure    Dispense:  90 tablet    Refill:  3      Follow-up: Return in about 6 months (around 09/09/2021).  WClaretta Fraise M.D.

## 2021-03-14 ENCOUNTER — Encounter: Payer: Self-pay | Admitting: Family Medicine

## 2021-03-21 ENCOUNTER — Encounter: Payer: Self-pay | Admitting: Family Medicine

## 2021-04-16 ENCOUNTER — Inpatient Hospital Stay: Admission: RE | Admit: 2021-04-16 | Payer: BC Managed Care – PPO | Source: Ambulatory Visit

## 2021-04-17 ENCOUNTER — Encounter: Payer: Self-pay | Admitting: Family Medicine

## 2021-04-17 NOTE — Telephone Encounter (Signed)
This would require an office visit

## 2021-04-18 ENCOUNTER — Ambulatory Visit
Admission: RE | Admit: 2021-04-18 | Discharge: 2021-04-18 | Disposition: A | Discharge status: Home / Self Care | Payer: 59 | Source: Ambulatory Visit | Attending: Family Medicine | Admitting: Family Medicine

## 2021-04-18 ENCOUNTER — Other Ambulatory Visit: Payer: Self-pay

## 2021-04-18 DIAGNOSIS — Z1231 Encounter for screening mammogram for malignant neoplasm of breast: Secondary | ICD-10-CM

## 2021-04-23 ENCOUNTER — Other Ambulatory Visit: Payer: Self-pay | Admitting: Family Medicine

## 2021-04-23 DIAGNOSIS — Z1211 Encounter for screening for malignant neoplasm of colon: Secondary | ICD-10-CM

## 2021-05-14 ENCOUNTER — Encounter: Payer: Self-pay | Admitting: Family Medicine

## 2021-05-14 LAB — COLOGUARD: COLOGUARD: NEGATIVE

## 2021-06-02 ENCOUNTER — Encounter: Payer: Self-pay | Admitting: Nurse Practitioner

## 2021-06-02 ENCOUNTER — Ambulatory Visit (INDEPENDENT_AMBULATORY_CARE_PROVIDER_SITE_OTHER): Payer: 59 | Admitting: Nurse Practitioner

## 2021-06-02 ENCOUNTER — Ambulatory Visit (INDEPENDENT_AMBULATORY_CARE_PROVIDER_SITE_OTHER): Payer: 59

## 2021-06-02 VITALS — BP 134/84 | HR 91 | Temp 98.3°F | Resp 20 | Ht 66.0 in | Wt 247.0 lb

## 2021-06-02 DIAGNOSIS — M25562 Pain in left knee: Secondary | ICD-10-CM

## 2021-06-02 MED ORDER — PREDNISONE 10 MG (21) PO TBPK
ORAL_TABLET | ORAL | 0 refills | Status: DC
Start: 2021-06-02 — End: 2021-06-16

## 2021-06-02 NOTE — Progress Notes (Signed)
° °  Subjective:    Patient ID: Bethany Richardson, female    DOB: 1970/08/01, 51 y.o.   MRN: [id number]   Chief Complaint: Knee Pain   Knee Pain  Incident onset: Left knee pain started 1 week ago. There was no injury mechanism. The pain is present in the left knee. The quality of the pain is described as aching, burning and stabbing. The pain is at a severity of 8/10. The pain is moderate (pain radiates down left leg to foot.). The pain has been Constant since onset. Associated symptoms include a loss of motion. Pertinent negatives include no numbness. She reports no foreign bodies present. The symptoms are aggravated by movement. She has tried acetaminophen for the symptoms. The treatment provided mild relief.       Review of Systems  Neurological:  Negative for numbness.      Objective:   Physical Exam Constitutional:      Appearance: Normal appearance.  Cardiovascular:     Rate and Rhythm: Normal rate and regular rhythm.     Heart sounds: Normal heart sounds.  Pulmonary:     Effort: Pulmonary effort is normal.     Breath sounds: Normal breath sounds.  Musculoskeletal:     Comments: Cannot cross left leg up over right. No knee effusion No patella tenderness FROM with pain on inversion of knee  Skin:    General: Skin is warm.  Neurological:     General: No focal deficit present.     Mental Status: She is alert and oriented to person, place, and time.  Psychiatric:        Mood and Affect: Mood normal.        Behavior: Behavior normal.    BP 134/84    Pulse 91    Temp 98.3 F (36.8 C) (Temporal)    Resp 20    Ht 5' 6"  (1.676 m)    Wt 247 lb (112 kg)    LMP 01/13/2018 (Exact Date)    SpO2 95%    BMI 39.87 kg/m    Left knee xray- mild osteo arthritis-Preliminary reading by Ronnald Collum, FNP  [facility]     Assessment & Plan:  Bethany Richardson in today with chief complaint of Knee Pain and Sinus Problem   1. Acute pain of left knee Rest Ice bid Compression wrap  Elevate  when sitting - DG Knee 1-2 Views Left - predniSONE (STERAPRED UNI-PAK 21 TAB) 10 MG (21) TBPK tablet; As directed x 6 days  Dispense: 21 tablet; Refill: 0    The above assessment and management plan was discussed with the patient. The patient verbalized understanding of and has agreed to the management plan. Patient is aware to call the clinic if symptoms persist or worsen. Patient is aware when to return to the clinic for a follow-up visit. Patient educated on when it is appropriate to go to the emergency department.   Mary-Margaret Hassell Done, FNP

## 2021-06-02 NOTE — Patient Instructions (Signed)
Acute Knee Pain, Adult Many things can cause knee pain. Sometimes, knee pain is sudden (acute) and may be caused by damage, swelling, or irritation of the muscles and tissues that support your knee. The pain often goes away on its own with time and rest. If the pain does not go away, tests may be done to find out what is causing the pain. Follow these instructions at home: If you have a knee sleeve or brace:  Wear the knee sleeve or brace as told by your doctor. Take it off only as told by your doctor. Loosen it if your toes: Tingle. Become numb. Turn cold and blue. Keep it clean. If the knee sleeve or brace is not waterproof: Do not let it get wet. Cover it with a watertight covering when you take a bath or shower. Activity Rest your knee. Do not do things that cause pain or make pain worse. Avoid activities where both feet leave the ground at the same time (high-impact activities). Examples are running, jumping rope, and doing jumping jacks. Work with a physical therapist to make a safe exercise program, as told by your doctor. Managing pain, stiffness, and swelling  If told, put ice on the knee. To do this: If you have a removable knee sleeve or brace, take it off as told by your doctor. Put ice in a plastic bag. Place a towel between your skin and the bag. Leave the ice on for 20 minutes, 2-3 times a day. Take off the ice if your skin turns bright red. This is very important. If you cannot feel pain, heat, or cold, you have a greater risk of damage to the area. If told, use an elastic bandage to put pressure (compression) on your injured knee. Raise your knee above the level of your heart while you are sitting or lying down. Sleep with a pillow under your knee. General instructions Take over-the-counter and prescription medicines only as told by your doctor. Do not smoke or use any products that contain nicotine or tobacco. If you need help quitting, ask your doctor. If you are  overweight, work with your doctor and a food expert (dietitian) to set goals to lose weight. Being overweight can make your knee hurt more. Watch for any changes in your symptoms. Keep all follow-up visits. Contact a doctor if: The knee pain does not stop. The knee pain changes or gets worse. You have a fever along with knee pain. Your knee is red or feels warm when you touch it. Your knee gives out or locks up. Get help right away if: Your knee swells, and the swelling gets worse. You cannot move your knee. You have very bad knee pain that does not get better with pain medicine. Summary Many things can cause knee pain. The pain often goes away on its own with time and rest. Your doctor may do tests to find out the cause of the pain. Watch for any changes in your symptoms. Relieve your pain with rest, medicines, light activity, and use of ice. Get help right away if you cannot move your knee or your knee pain is very bad. This information is not intended to replace advice given to you by your health care provider. Make sure you discuss any questions you have with your health care provider. Document Revised: 10/18/2019 Document Reviewed: 10/18/2019 Elsevier Patient Education  2022 [employer].

## 2021-06-15 ENCOUNTER — Encounter: Payer: Self-pay | Admitting: Family Medicine

## 2021-06-16 ENCOUNTER — Other Ambulatory Visit: Payer: Self-pay | Admitting: Family Medicine

## 2021-06-16 DIAGNOSIS — M25569 Pain in unspecified knee: Secondary | ICD-10-CM

## 2021-06-16 MED ORDER — NABUMETONE 500 MG PO TABS: 1000.0000 mg | ORAL_TABLET | Freq: Two times a day (BID) | ORAL | 1 refills | Status: DC

## 2021-06-17 NOTE — Telephone Encounter (Signed)
Let Bethany Richardson know that she should go ahead and take the medicine as prescribed.  After she has been taking it for 2 weeks have her drop by for a CMP for evaluation of any potential problems with the liver.  In that timeframe the only treatment that would be necessary would be to stop the medication if there is a climb in her liver functions or enzymes.

## 2021-06-23 ENCOUNTER — Ambulatory Visit: Payer: 59 | Admitting: Family

## 2021-07-02 ENCOUNTER — Ambulatory Visit (INDEPENDENT_AMBULATORY_CARE_PROVIDER_SITE_OTHER): Payer: 59 | Admitting: Orthopedic Surgery

## 2021-07-02 ENCOUNTER — Ambulatory Visit: Payer: 59

## 2021-07-02 ENCOUNTER — Encounter: Payer: Self-pay | Admitting: Orthopedic Surgery

## 2021-07-02 ENCOUNTER — Other Ambulatory Visit: Payer: Self-pay

## 2021-07-02 VITALS — BP 155/92 | HR 96 | Ht 67.0 in | Wt 240.4 lb

## 2021-07-02 DIAGNOSIS — M5410 Radiculopathy, site unspecified: Secondary | ICD-10-CM

## 2021-07-02 MED ORDER — TIZANIDINE HCL 4 MG PO TABS: 4.0000 mg | ORAL_TABLET | Freq: Four times a day (QID) | ORAL | 0 refills | Status: DC | PRN

## 2021-07-02 MED ORDER — PREDNISONE 10 MG (48) PO TBPK: ORAL_TABLET | Freq: Every day | ORAL | 0 refills | Status: DC

## 2021-07-02 MED ORDER — GABAPENTIN 100 MG PO CAPS: 100.0000 mg | ORAL_CAPSULE | Freq: Three times a day (TID) | ORAL | 0 refills | Status: DC

## 2021-07-02 NOTE — Progress Notes (Signed)
This is a new patient referral from Dr. Livia Snellen  Chief Complaint  Patient presents with   Knee Pain    LT/ painful x 3 weeks/ woke up with the pain    This is a 51 year old female presents to [place] with acute onset of left knee pain  She was treated with a 6-day Dosepak she had an x-ray of her left knee which was normal  She says her left knee started hurting acutely without any trauma this then became associated with burning pain down the left lateral leg into the ankle area with feelings of giving way of the left leg with pain in the front and back of the knee and spasms in the leg  The patient denies any back pain but has a history of a bulging disc many years ago  She says she is in severe pain at this time  She took Tylenol as well  Review of systems bowel bladder function normal  Past Medical History:  Diagnosis Date   Adenomyosis    ASCUS of cervix with negative high risk HPV 11/2017   Chronic bronchitis ([place])    Depression    GERD (gastroesophageal reflux disease)    Hypertension    Menorrhagia    Obesity    Pneumonia 2013   Past Surgical History:  Procedure Laterality Date   BREAST BIOPSY Right 11/23/2017   [place]   HYSTEROSCOPY WITH NOVASURE N/A 10/19/2014   Procedure: HYSTEROSCOPY, ENDOMETRIAL ABLATION;  Surgeon: Florian Buff, MD;  Location: [facility];  Service: Gynecology;  Laterality: N/A;  Uterine Cavity Length 5.0cm Uterine Cavity Width 4.5cm Power 124 Time 2 minutes   ROBOTIC ASSISTED TOTAL HYSTERECTOMY WITH BILATERAL SALPINGO OOPHERECTOMY Bilateral 01/19/2018   Procedure: XI ROBOTIC ASSISTED TOTAL HYSTERECTOMY WITH BILATERAL SALPINGO OOPHORECTOMY;  Surgeon: Princess Bruins, MD;  Location: [facility];  Service: Gynecology;  Laterality: Bilateral;   TUBAL LIGATION Bilateral 1995   ,BP (!) 155/92    Pulse 96    Ht 5' 7"  (1.702 m)    Wt 240 lb 6.4 oz (109 kg)    LMP 01/13/2018 (Exact Date)    BMI 37.65 kg/m   She is awake and alert she is oriented x3 mood and affect  are normal she is walking without a supportive device but has a slight limp favoring her left leg  Her knee exam is benign in terms of swelling and instability although palpation reveals pain around the kneecap.  She also has pain lateral thigh up into the hip and lower back with tenderness in the lower back and then pain along the anterior compartment of the left lower leg but normal plantarflexion dorsiflexion and sensation in the foot  The x-ray of her knee was from an outside source my independent interpretation of the x-ray is that it shows very mild narrowing of the joint medial of both knees.  I did an internal image of her lower back and that shows disc spaces are preserved there is some mild anterior lipping there is a questionable spondylolysis at L5-S1 further imaging would be needed if symptoms warrant.  I suspect she has a radiculopathy of the left leg  Unguinal start her on tizanidine gabapentin and a 12-day Dosepak and I will see her in 2 weeks and reexamine the knee if needed  Encounter Diagnosis  Name Primary?   Radicular pain of left lower extremity Yes    Meds ordered this encounter  Medications   tiZANidine (ZANAFLEX) 4 MG tablet    Sig:  Take 1 tablet (4 mg total) by mouth every 6 (six) hours as needed for muscle spasms.    Dispense:  30 tablet    Refill:  0   predniSONE (STERAPRED UNI-PAK 48 TAB) 10 MG (48) TBPK tablet    Sig: Take by mouth daily. 10 mg 12 days double strength as directed    Dispense:  48 tablet    Refill:  0   gabapentin (NEURONTIN) 100 MG capsule    Sig: Take 1 capsule (100 mg total) by mouth 3 (three) times daily for 14 days.    Dispense:  42 capsule    Refill:  0

## 2021-07-04 ENCOUNTER — Encounter: Payer: Self-pay | Admitting: Orthopedic Surgery

## 2021-07-04 DIAGNOSIS — M25562 Pain in left knee: Secondary | ICD-10-CM

## 2021-07-07 ENCOUNTER — Ambulatory Visit: Payer: 59 | Admitting: Orthopedic Surgery

## 2021-07-09 ENCOUNTER — Other Ambulatory Visit: Payer: Self-pay | Admitting: Orthopedic Surgery

## 2021-07-09 ENCOUNTER — Encounter: Payer: Self-pay | Admitting: Orthopedic Surgery

## 2021-07-09 DIAGNOSIS — M5410 Radiculopathy, site unspecified: Secondary | ICD-10-CM

## 2021-07-09 MED ORDER — METHOCARBAMOL 500 MG PO TABS: 500.0000 mg | ORAL_TABLET | Freq: Four times a day (QID) | ORAL | 1 refills | Status: AC | PRN

## 2021-07-09 NOTE — Progress Notes (Signed)
Meds ordered this encounter  Medications   methocarbamol (ROBAXIN) 500 MG tablet    Sig: Take 1 tablet (500 mg total) by mouth every 6 (six) hours as needed for muscle spasms.    Dispense:  60 tablet    Refill:  1

## 2021-07-16 ENCOUNTER — Ambulatory Visit (INDEPENDENT_AMBULATORY_CARE_PROVIDER_SITE_OTHER): Payer: 59 | Admitting: Orthopedic Surgery

## 2021-07-16 ENCOUNTER — Other Ambulatory Visit: Payer: Self-pay

## 2021-07-16 DIAGNOSIS — M25562 Pain in left knee: Secondary | ICD-10-CM | POA: Diagnosis not present

## 2021-07-16 DIAGNOSIS — M5410 Radiculopathy, site unspecified: Secondary | ICD-10-CM | POA: Diagnosis not present

## 2021-07-16 NOTE — Progress Notes (Signed)
Follow-up ? ?Chief Complaint  ?Patient presents with  ? Back Pain  ?  LBP w/ radicular ?Pain seems to be improving ?Pt states she fell off of a porch in Jan 4 ft drop  ? ?Encounter Diagnoses  ?Name Primary?  ? Acute pain of left knee Yes  ? Radicular pain of left lower extremity   ? ? ?Bethany Richardson has improved significantly she just has a little bit of burning pain when the knee goes in the figure-of-four position.  She could not take the muscle relaxers made her to sleepy she did take the steroids and the gabapentin and a lot of her pain has subsided ? ?She is not having any radicular symptoms in the back or hip the burning sensation however is in the popliteal fossa ? ?She is scheduled for MRI next week and I will call her when I get the results to let her know what the next steps are and the results of this of the test ?

## 2021-07-22 ENCOUNTER — Encounter: Payer: Self-pay | Admitting: Family Medicine

## 2021-07-23 ENCOUNTER — Ambulatory Visit (INDEPENDENT_AMBULATORY_CARE_PROVIDER_SITE_OTHER): Payer: 59 | Admitting: Nurse Practitioner

## 2021-07-23 ENCOUNTER — Encounter: Payer: Self-pay | Admitting: Nurse Practitioner

## 2021-07-23 ENCOUNTER — Other Ambulatory Visit: Payer: Self-pay

## 2021-07-23 ENCOUNTER — Ambulatory Visit (HOSPITAL_COMMUNITY)
Admission: RE | Admit: 2021-07-23 | Discharge: 2021-07-23 | Disposition: A | Discharge status: Home / Self Care | Payer: 59 | Source: Ambulatory Visit | Attending: Orthopedic Surgery | Admitting: Orthopedic Surgery

## 2021-07-23 VITALS — BP 137/80 | HR 97 | Temp 98.3°F | Ht 67.0 in | Wt 236.0 lb

## 2021-07-23 DIAGNOSIS — K148 Other diseases of tongue: Secondary | ICD-10-CM | POA: Diagnosis not present

## 2021-07-23 DIAGNOSIS — K219 Gastro-esophageal reflux disease without esophagitis: Secondary | ICD-10-CM | POA: Diagnosis not present

## 2021-07-23 DIAGNOSIS — M25562 Pain in left knee: Secondary | ICD-10-CM | POA: Diagnosis not present

## 2021-07-23 MED ORDER — PANTOPRAZOLE SODIUM 40 MG PO TBEC: 40.0000 mg | DELAYED_RELEASE_TABLET | Freq: Every day | ORAL | 3 refills | Status: DC

## 2021-07-23 NOTE — Progress Notes (Signed)
? ?Acute Office Visit ? ?Subjective:  ? ? Patient ID: Bethany Richardson, female    DOB: [birth year], 51 y.o.   MRN: [id number] ? ?Chief Complaint  ?Patient presents with  ? Gastroesophageal Reflux  ? ? ?HPI ?Patient is in today for GERD: Paitent complains of heartburn. This has been associated with belching, bilious reflux, and hematemesis.  She denies choking on food, cough, deep pressure at base of neck, and dysphagia. Symptoms have been present for a few months. She denies dysphagia.  She has not lost weight. She denies melena, hematochezia, hematemesis, and coffee ground emesis. Medical therapy in the past has included  OTC ?.  ? ?Patient's tongue is discolored for no known reason, patient has no pain, fever or mouth sores associated with present concerns ? ?Past Medical History:  ?Diagnosis Date  ? Adenomyosis   ? ASCUS of cervix with negative high risk HPV 11/2017  ? Chronic bronchitis ([place])   ? Depression   ? GERD (gastroesophageal reflux disease)   ? Hypertension   ? Menorrhagia   ? Obesity   ? Pneumonia 2013  ? ? ?Past Surgical History:  ?Procedure Laterality Date  ? BREAST BIOPSY Right 11/23/2017  ? [place]  ? HYSTEROSCOPY WITH NOVASURE N/A 10/19/2014  ? Procedure: HYSTEROSCOPY, ENDOMETRIAL ABLATION;  Surgeon: Florian Buff, MD;  Location: [facility];  Service: Gynecology;  Laterality: N/A;  Uterine Cavity Length 5.0cm ?Uterine Cavity Width 4.5cm ?Power 124 ?Time 2 minutes  ? ROBOTIC ASSISTED TOTAL HYSTERECTOMY WITH BILATERAL SALPINGO OOPHERECTOMY Bilateral 01/19/2018  ? Procedure: XI ROBOTIC ASSISTED TOTAL HYSTERECTOMY WITH BILATERAL SALPINGO OOPHORECTOMY;  Surgeon: Princess Bruins, MD;  Location: [facility];  Service: Gynecology;  Laterality: Bilateral;  ? TUBAL LIGATION Bilateral 1995  ? ? ?Family History  ?Problem Relation Age of Onset  ? Hypertension Mother   ? Hypertension Father   ? Breast cancer Neg Hx   ? ? ?Social History  ? ?Socioeconomic History  ? Marital status: Married  ?  Spouse name: Not on file  ? Number  of children: Not on file  ? Years of education: Not on file  ? Highest education level: Not on file  ?Occupational History  ? Not on file  ?Tobacco Use  ? Smoking status: Former  ?  Packs/day: 2.00  ?  Years: 20.00  ?  Pack years: 40.00  ?  Types: Cigarettes  ?  Quit date: 01/31/2009  ?  Years since quitting: 12.4  ? Smokeless tobacco: Never  ?Vaping Use  ? Vaping Use: Never used  ?Substance and Sexual Activity  ? Alcohol use: Yes  ?  Comment: occassional  ? Drug use: No  ? Sexual activity: Not Currently  ?  Partners: Male  ?  Birth control/protection: Surgical  ?  Comment: 1st intercourse 18 yo-1 partner-BTL  ?Other Topics Concern  ? Not on file  ?Social History Narrative  ? Not on file  ? ?Social Determinants of Health  ? ?Financial Resource Strain: Not on file  ?Food Insecurity: Not on file  ?Transportation Needs: Not on file  ?Physical Activity: Not on file  ?Stress: Not on file  ?Social Connections: Not on file  ?Intimate Partner Violence: Not on file  ? ? ?Outpatient Medications Prior to Visit  ?Medication Sig Dispense Refill  ? amLODipine (NORVASC) 10 MG tablet Take 1 tablet (10 mg total) by mouth daily. For blood pressure 90 tablet 3  ? aspirin EC 81 MG tablet Take 81 mg by mouth in the morning and  at bedtime. Swallow whole.    ? busPIRone (BUSPAR) 5 MG tablet Take 5 mg by mouth 2 (two) times daily.    ? estradiol (ESTRACE) 1 MG tablet Take 1.5 tablets (1.5 mg total) by mouth daily. 135 tablet 3  ? fexofenadine-pseudoephedrine (ALLEGRA-D 24) 180-240 MG 24 hr tablet Take 1 tablet by mouth every evening. For allergy and congestion 30 tablet 11  ? gabapentin (NEURONTIN) 100 MG capsule TAKE 1 CAPSULE BY MOUTH THREE TIMES DAILY FOR 14 DAYS 42 capsule 0  ? methocarbamol (ROBAXIN) 500 MG tablet Take 1 tablet (500 mg total) by mouth every 6 (six) hours as needed for muscle spasms. 60 tablet 1  ? predniSONE (STERAPRED UNI-PAK 48 TAB) 10 MG (48) TBPK tablet Take by mouth daily. 10 mg 12 days double strength as directed  48 tablet 0  ? tiZANidine (ZANAFLEX) 4 MG tablet Take 1 tablet (4 mg total) by mouth every 6 (six) hours as needed for muscle spasms. 30 tablet 0  ? ?No facility-administered medications prior to visit.  ? ? ?Allergies  ?Allergen Reactions  ? Zoloft [Sertraline Hcl] Other (See Comments)  ?  Suicide attempt.  ? Ace Inhibitors Cough  ? Tizanidine Swelling  ? ? ?Review of Systems  ?Constitutional: Negative.   ?HENT: Negative.    ?Respiratory: Negative.    ?Gastrointestinal:  Positive for abdominal pain. Negative for nausea.  ?Musculoskeletal: Negative.   ?Skin: Negative.  Negative for rash.  ?All other systems reviewed and are negative. ? ?   ?Objective:  ?  ?Physical Exam ?Vitals and nursing note reviewed.  ?Constitutional:   ?   Appearance: Normal appearance.  ?HENT:  ?   Head: Normocephalic.  ?   Right Ear: External ear normal.  ?   Left Ear: External ear normal.  ?   Nose: Nose normal.  ?   Mouth/Throat:  ?   Pharynx: Oropharynx is clear.  ?Eyes:  ?   Conjunctiva/sclera: Conjunctivae normal.  ?Cardiovascular:  ?   Rate and Rhythm: Normal rate and regular rhythm.  ?   Pulses: Normal pulses.  ?   Heart sounds: Normal heart sounds.  ?Pulmonary:  ?   Effort: Pulmonary effort is normal.  ?   Breath sounds: Normal breath sounds.  ?Abdominal:  ?   General: Bowel sounds are normal.  ?Neurological:  ?   General: No focal deficit present.  ?   Mental Status: She is alert and oriented to person, place, and time.  ?Psychiatric:     ?   Behavior: Behavior normal.  ? ? ?BP 137/80   Pulse 97   Temp 98.3 ?F (36.8 ?C)   Ht 5' 7"  (1.702 m)   Wt 236 lb (107 kg)   LMP 01/13/2018 (Exact Date)   SpO2 96%   BMI 36.96 kg/m?  ?Wt Readings from Last 3 Encounters:  ?07/23/21 236 lb (107 kg)  ?07/02/21 240 lb 6.4 oz (109 kg)  ?06/02/21 247 lb (112 kg)  ? ? ?Health Maintenance Due  ?Topic Date Due  ? FOOT EXAM  Never done  ? URINE MICROALBUMIN  Never done  ? HIV Screening  Never done  ? Hepatitis C Screening  Never done  ? HEMOGLOBIN  A1C  12/27/2016  ? COLONOSCOPY (Pts 45-52yr Insurance coverage will need to be confirmed)  Never done  ? OPHTHALMOLOGY EXAM  05/20/2017  ? COVID-19 Vaccine (3 - Booster for Pfizer series) 11/10/2019  ? PAP SMEAR-Modifier  12/02/2020  ? ? ?There are no preventive care  reminders to display for this patient. ? ? ?Lab Results  ?Component Value Date  ? TSH 1.240 11/12/2017  ? ?Lab Results  ?Component Value Date  ? WBC 13.6 (H) 01/20/2018  ? HGB 13.8 01/20/2018  ? HCT 41.6 01/20/2018  ? MCV 80.0 01/20/2018  ? PLT 254 01/20/2018  ? ?Lab Results  ?Component Value Date  ? NA 139 01/13/2018  ? K 3.2 (L) 01/13/2018  ? CO2 27 01/13/2018  ? GLUCOSE 193 (H) 01/13/2018  ? BUN 14 01/13/2018  ? CREATININE 0.75 01/13/2018  ? BILITOT 0.5 11/12/2017  ? ALKPHOS 72 11/12/2017  ? AST 16 11/12/2017  ? ALT 30 11/12/2017  ? PROT 6.6 11/12/2017  ? ALBUMIN 4.2 11/12/2017  ? CALCIUM 8.4 (L) 01/13/2018  ? ANIONGAP 9 01/13/2018  ? ?Lab Results  ?Component Value Date  ? CHOL 173 11/12/2017  ? ?Lab Results  ?Component Value Date  ? HDL 47 11/12/2017  ? ?Lab Results  ?Component Value Date  ? LDLCALC 100 (H) 11/12/2017  ? ?Lab Results  ?Component Value Date  ? TRIG 132 11/12/2017  ? ?Lab Results  ?Component Value Date  ? CHOLHDL 3.7 11/12/2017  ? ?Lab Results  ?Component Value Date  ? HGBA1C 7.3 (H) 06/29/2016  ? ? ?   ?Assessment & Plan:  ?Patients tongue not getting better, advised decreasing coffee, Tea, keeping tongue moisturized, ? ?Started patient on Protonix 40 mg tablet by mouth daily. Follow up with unresolved symptoms. ? ? ?Problem List Items Addressed This Visit   ?None ?Visit Diagnoses   ? ? GERD without esophagitis    -  Primary  ? Relevant Medications  ? pantoprazole (PROTONIX) 40 MG tablet  ? Discoloration of tongue      ? ?  ? ? ? ?Meds ordered this encounter  ?Medications  ? pantoprazole (PROTONIX) 40 MG tablet  ?  Sig: Take 1 tablet (40 mg total) by mouth daily.  ?  Dispense:  30 tablet  ?  Refill:  3  ?  Order Specific Question:    Supervising Provider  ?  AnswerClaretta Fraise [[id number]]  ? ? ? ?Ivy Lynn, NP ? ?

## 2021-07-23 NOTE — Patient Instructions (Signed)
Food Choices for Gastroesophageal Reflux Disease, Adult ?When you have gastroesophageal reflux disease (GERD), the foods you eat and your eating habits are very important. Choosing the right foods can help ease your discomfort. Think about working with a food expert (dietitian) to help you make good choices. ?What are tips for following this plan? ?Reading food labels ?Look for foods that are low in saturated fat. Foods that may help with your symptoms include: ?Foods that have less than 5% of daily value (DV) of fat. ?Foods that have 0 grams of trans fat. ?Cooking ?Do not fry your food. ?Cook your food by baking, steaming, grilling, or broiling. These are all methods that do not need a lot of fat for cooking. ?To add flavor, try to use herbs that are low in spice and acidity. ?Meal planning ? ?Choose healthy foods that are low in fat, such as: ?Fruits and vegetables. ?Whole grains. ?Low-fat dairy products. ?Lean meats, fish, and poultry. ?Eat small meals often instead of eating 3 large meals each day. Eat your meals slowly in a place where you are relaxed. Avoid bending over or lying down until 2-3 hours after eating. ?Limit high-fat foods such as fatty meats or fried foods. ?Limit your intake of fatty foods, such as oils, butter, and shortening. ?Avoid the following as told by your doctor: ?Foods that cause symptoms. These may be different for different people. Keep a food diary to keep track of foods that cause symptoms. ?Alcohol. ?Drinking a lot of liquid with meals. ?Eating meals during the 2-3 hours before bed. ?Lifestyle ?Stay at a healthy weight. Ask your doctor what weight is healthy for you. If you need to lose weight, work with your doctor to do so safely. ?Exercise for at least 30 minutes on 5 or more days each week, or as told by your doctor. ?Wear loose-fitting clothes. ?Do not smoke or use any products that contain nicotine or tobacco. If you need help quitting, ask your doctor. ?Sleep with the head  of your bed higher than your feet. Use a wedge under the mattress or blocks under the bed frame to raise the head of the bed. ?Chew sugar-free gum after meals. ?What foods should eat? ?Eat a healthy, well-balanced diet of fruits, vegetables, whole grains, low-fat dairy products, lean meats, fish, and poultry. Each person is different. ?Foods that may cause symptoms in one person may not cause any symptoms in another person. Work with your doctor to find foods that are safe for you. ?The items listed above may not be a complete list of what you can eat and drink. Contact a food expert for more options. ?What foods should I avoid? ?Limiting some of these foods may help in managing the symptoms of GERD. Everyone is different. Talk with a food expert or your doctor to help you find the exact foods to avoid, if any. ?Fruits ?Any fruits prepared with added fat. Any fruits that cause symptoms. For some people, this may include citrus fruits, such as oranges, grapefruit, pineapple, and lemons. ?Vegetables ?Deep-fried vegetables. [place] fries. Any vegetables prepared with added fat. Any vegetables that cause symptoms. For some people, this may include tomatoes and tomato products, chili peppers, onions and garlic, and horseradish. ?Grains ?Pastries or quick breads with added fat. ?Meats and other proteins ?High-fat meats, such as fatty beef or pork, hot dogs, ribs, ham, sausage, salami, and bacon. Fried meat or protein, including fried fish and fried chicken. Nuts and nut butters, in large amounts. ?Dairy ?Whole milk  and chocolate milk. Sour cream. Cream. Ice cream. Cream cheese. Milkshakes. ?Fats and oils ?Butter. Margarine. Shortening. Ghee. ?Beverages ?Coffee and tea, with or without caffeine. Carbonated beverages. Sodas. Energy drinks. Fruit juice made with acidic fruits, such as orange or grapefruit. Tomato juice. Alcoholic drinks. ?Sweets and desserts ?Chocolate and cocoa. Donuts. ?Seasonings and condiments ?Pepper.  Peppermint and spearmint. Added salt. Any condiments, herbs, or seasonings that cause symptoms. For some people, this may include curry, hot sauce, or vinegar-based salad dressings. ?The items listed above may not be a complete list of what you should not eat and drink. Contact a food expert for more options. ?Questions to ask your doctor ?Diet and lifestyle changes are often the first steps that are taken to manage symptoms of GERD. If diet and lifestyle changes do not help, talk with your doctor about taking medicines. ?Where to find more information ?International Foundation for Gastrointestinal Disorders: aboutgerd.org ?Summary ?When you have GERD, food and lifestyle choices are very important in easing your symptoms. ?Eat small meals often instead of 3 large meals a day. Eat your meals slowly and in a place where you are relaxed. ?Avoid bending over or lying down until 2-3 hours after eating. ?Limit high-fat foods such as fatty meats or fried foods. ?This information is not intended to replace advice given to you by your health care provider. Make sure you discuss any questions you have with your health care provider. ?Document Revised: 11/13/2019 Document Reviewed: 11/13/2019 ?Elsevier Patient Education ? [place]. ? ?

## 2021-07-24 ENCOUNTER — Encounter: Payer: Self-pay | Admitting: Nurse Practitioner

## 2021-07-24 ENCOUNTER — Encounter: Payer: Self-pay | Admitting: Orthopedic Surgery

## 2021-07-28 ENCOUNTER — Ambulatory Visit: Payer: 59 | Admitting: Orthopedic Surgery

## 2021-08-05 ENCOUNTER — Other Ambulatory Visit (INDEPENDENT_AMBULATORY_CARE_PROVIDER_SITE_OTHER): Payer: 59

## 2021-08-05 ENCOUNTER — Encounter: Payer: Self-pay | Admitting: Nurse Practitioner

## 2021-08-05 ENCOUNTER — Ambulatory Visit (INDEPENDENT_AMBULATORY_CARE_PROVIDER_SITE_OTHER): Payer: 59 | Admitting: Nurse Practitioner

## 2021-08-05 VITALS — BP 136/90 | HR 100 | Ht 67.0 in | Wt 238.0 lb

## 2021-08-05 DIAGNOSIS — R042 Hemoptysis: Secondary | ICD-10-CM

## 2021-08-05 DIAGNOSIS — R1311 Dysphagia, oral phase: Secondary | ICD-10-CM

## 2021-08-05 DIAGNOSIS — R1310 Dysphagia, unspecified: Secondary | ICD-10-CM | POA: Diagnosis not present

## 2021-08-05 DIAGNOSIS — K219 Gastro-esophageal reflux disease without esophagitis: Secondary | ICD-10-CM

## 2021-08-05 LAB — COMPREHENSIVE METABOLIC PANEL
ALT: 39 U/L — ABNORMAL HIGH (ref 0–35)
AST: 26 U/L (ref 0–37)
Albumin: 4 g/dL (ref 3.5–5.2)
Alkaline Phosphatase: 79 U/L (ref 39–117)
BUN: 13 mg/dL (ref 6–23)
CO2: 29 mEq/L (ref 19–32)
Calcium: 9.6 mg/dL (ref 8.4–10.5)
Chloride: 95 mEq/L — ABNORMAL LOW (ref 96–112)
Creatinine, Ser: 0.81 mg/dL (ref 0.40–1.20)
GFR: 85.2 mL/min (ref >60.00)
Glucose, Bld: 402 mg/dL — ABNORMAL HIGH (ref 70–99)
Potassium: 4 mEq/L (ref 3.5–5.1)
Sodium: 131 mEq/L — ABNORMAL LOW (ref 135–145)
Total Bilirubin: 0.8 mg/dL (ref 0.2–1.2)
Total Protein: 6.6 g/dL (ref 6.0–8.3)

## 2021-08-05 LAB — CBC WITH DIFFERENTIAL/PLATELET
Basophils Absolute: 0 10*3/uL (ref 0.0–0.1)
Basophils Relative: 0.5 % (ref 0.0–3.0)
Eosinophils Absolute: 0.2 10*3/uL (ref 0.0–0.7)
Eosinophils Relative: 3.6 % (ref 0.0–5.0)
HCT: 44.4 % (ref 36.0–46.0)
Hemoglobin: 15.2 g/dL — ABNORMAL HIGH (ref 12.0–15.0)
Lymphocytes Relative: 27.9 % (ref 12.0–46.0)
Lymphs Abs: 1.6 10*3/uL (ref 0.7–4.0)
MCHC: 34.2 g/dL (ref 30.0–36.0)
MCV: 79.5 fl (ref 78.0–100.0)
Monocytes Absolute: 0.4 10*3/uL (ref 0.1–1.0)
Monocytes Relative: 7 % (ref 3.0–12.0)
Neutro Abs: 3.5 10*3/uL (ref 1.4–7.7)
Neutrophils Relative %: 61 % (ref 43.0–77.0)
Platelets: 178 10*3/uL (ref 150.0–400.0)
RBC: 5.59 Mil/uL — ABNORMAL HIGH (ref 3.87–5.11)
RDW: 14.4 % (ref 11.5–15.5)
WBC: 5.7 10*3/uL (ref 4.0–10.5)

## 2021-08-05 NOTE — Progress Notes (Signed)
? ? ? ?08/05/2021 ?[name] ?[id number] ?1970-06-07 ? ? ?CHIEF COMPLAINT: Coughing up blood ? ?HISTORY OF PRESENT ILLNESS: Bethany Richardson is a 51 year old female with a past medical history of arthritis anxiety, depression, hypertension and obesity. She presents to our office today as referred by Dr. Claretta Fraise for further evaluation regarding acid reflux.  She complains of coughing up bloody mucus which looks like "snot" every morning for the past 2 months.  No associated shortness of breath or chest pain.  She sometimes coughs excessively which results in gagging up white foamy secretions.  No true vomiting.  She is confident that she is not vomiting up any blood but is coughing up blood.  She has intermittent shortness of breath.  No chest pain.  She reported taking an antibiotic and steroid recently for a sinus infection which resulted in throat burning 1 or 2 weeks after she completed her treatment for sinusitis.  She was prescribed Pantoprazole 40 mg daily by her PCP which she took for 2 days and her throat burning abated so she stopped taking it.  She has noticed discoloration to her tongue for the past month or so.  She brushes her tongue aggressively which results in gagging triggers an episode of coughing up a small amount of bright red blood.  She also noted having sores in her mouth mouth which went away after she gargled with hydrogen peroxide.  She complains of having dysphagia with solid food and pills and daily for the past 2 to 3 years which has progressively worsened over the past year.  She describes feeling as if food or pills get stuck up high in her throat, she gags/coughs or drinks water and the stuck food or pill goes down.  She is passing normal formed brown bowel movement daily.  No rectal bleeding or black stools.  She reported undergoing a colonoscopy in her 51s due to rectal bleeding which showed hemorrhoids.  Cologuard was negative on 05/03/2021.  She is on ASA 81 mg  daily. ? ?Past Medical History:  ?Diagnosis Date  ? Adenomyosis   ? ASCUS of cervix with negative high risk HPV 11/2017  ? Chronic bronchitis ([place])   ? Depression   ? GERD (gastroesophageal reflux disease)   ? Hypertension   ? Menorrhagia   ? Obesity   ? Pneumonia 2013  ? ?Past Surgical History:  ?Procedure Laterality Date  ? BREAST BIOPSY Right 11/23/2017  ? [place]  ? HYSTEROSCOPY WITH NOVASURE N/A 10/19/2014  ? Procedure: HYSTEROSCOPY, ENDOMETRIAL ABLATION;  Surgeon: Florian Buff, MD;  Location: [facility];  Service: Gynecology;  Laterality: N/A;  Uterine Cavity Length 5.0cm ?Uterine Cavity Width 4.5cm ?Power 124 ?Time 2 minutes  ? ROBOTIC ASSISTED TOTAL HYSTERECTOMY WITH BILATERAL SALPINGO OOPHERECTOMY Bilateral 01/19/2018  ? Procedure: XI ROBOTIC ASSISTED TOTAL HYSTERECTOMY WITH BILATERAL SALPINGO OOPHORECTOMY;  Surgeon: Princess Bruins, MD;  Location: [facility];  Service: Gynecology;  Laterality: Bilateral;  ? TUBAL LIGATION Bilateral 1995  ? ?Social History: She is married.  She has 1 son and 1 daughter.  She smoked cigarettes 2ppd x age 14, quite 24 years. She drinks of  1 glass of wine or liquor on the holidays. No drug use.  ? ?Family History: Father had hypertension, GERD and ulcers.  Mother with hypertension.  Maternal grandmother with history of ovarian cancer.  Maternal aunt and paternal aunt with diabetes no known family history of esophageal, gastric or colon cancer. ? ?Allergies  ?Allergen Reactions  ?  Zoloft [Sertraline Hcl] Other (See Comments)  ?  Suicide attempt.  ? Ace Inhibitors Cough  ? Tizanidine Swelling  ? ?  ?Outpatient Encounter Medications as of 08/05/2021  ?Medication Sig  ? amLODipine (NORVASC) 10 MG tablet Take 1 tablet (10 mg total) by mouth daily. For blood pressure  ? aspirin EC 81 MG tablet Take 81 mg by mouth in the morning and at bedtime. Swallow whole.  ? busPIRone (BUSPAR) 5 MG tablet Take 5 mg by mouth 2 (two) times daily.  ? estradiol (ESTRACE) 1 MG tablet Take 1.5 tablets (1.5 mg  total) by mouth daily.  ? fexofenadine-pseudoephedrine (ALLEGRA-D 24) 180-240 MG 24 hr tablet Take 1 tablet by mouth every evening. For allergy and congestion  ? gabapentin (NEURONTIN) 100 MG capsule TAKE 1 CAPSULE BY MOUTH THREE TIMES DAILY FOR 14 DAYS  ? methocarbamol (ROBAXIN) 500 MG tablet Take 1 tablet (500 mg total) by mouth every 6 (six) hours as needed for muscle spasms.  ? pantoprazole (PROTONIX) 40 MG tablet Take 1 tablet (40 mg total) by mouth daily.  ? [DISCONTINUED] predniSONE (STERAPRED UNI-PAK 48 TAB) 10 MG (48) TBPK tablet Take by mouth daily. 10 mg 12 days double strength as directed  ? [DISCONTINUED] tiZANidine (ZANAFLEX) 4 MG tablet Take 1 tablet (4 mg total) by mouth every 6 (six) hours as needed for muscle spasms.  ? ?No facility-administered encounter medications on file as of 08/05/2021.  ? ? ?REVIEW OF SYSTEMS:  ?Gen: Denies fever, sweats or chills. No weight loss.  ?CV: Denies chest pain, palpitations or edema. ?Resp: + Shortness of breath and hemoptysis.  ?GI: See HPI.   ?GU : Denies urinary burning, blood in urine, increased urinary frequency or incontinence. ?MS: + Arthritis.  ?Derm: Denies rash, itchiness, skin lesions or unhealing ulcers. ?Psych+ Anxiety. Heme: Denies bruising, easy bleeding. ?Neuro:  Denies headaches, dizziness or paresthesias. ?Endo:  Denies any problems with DM, thyroid or adrenal function. ? ?PHYSICAL EXAM: ?BP 136/90   Pulse 100   Ht 5' 7"  (1.702 m)   Wt 238 lb (108 kg)   LMP 01/13/2018 (Exact Date)   BMI 37.28 kg/m?  ?General: 51 year old female in no acute distress ?Head: Normocephalic and atraumatic. ?Eyes:  Sclerae non-icteric, conjunctive pink. ?Ears: Normal auditory acuity. ?Mouth: Brown-yellow coating on tongue.  Dentition intact. No ulcers or lesions.  ?Neck: Supple, no lymphadenopathy or thyromegaly.  ?Lungs: Clear bilaterally to auscultation without wheezes, crackles or rhonchi. ?Heart: Regular rate and rhythm. No murmur, rub or gallop appreciated.   ?Abdomen: Soft obese abdomen, nontender, non distended. No masses. No hepatosplenomegaly. Normoactive bowel sounds x 4 quadrants.  ?Rectal: Deferred. ?Musculoskeletal: Symmetrical with no gross deformities. ?Skin: Warm and dry. No rash or lesions on visible extremities. ?Extremities: No edema. ?Neurological: Alert oriented x 4, no focal deficits.  ?Psychological:  Alert and cooperative. Normal mood and affect. ? ?ASSESSMENT AND PLAN: ? ?38) 51 year old female describes having active daily hemoptysis in the morning for the past 2 months.  Intermittent shortness of breath.  No chest pain.  Recently treated for sinus infection with an antibiotic and steroid.  ?-Patient to contact her PCP for further hemoptysis evaluation, recommended a chest CT angiogram to rule out PE or any other pulmonary pathology ?-Patient instructed go to the ED if she develops severe shortness of breath ? ?2) History of GERD, oral phase dysphagia with pills and solid foods x 2 to 3 years which has progressively worsened over the past year.  Recent throat burning abated after  she took Pantoprazole for 2 days so she stopped taking it. ?-EGD with possible esophageal dilatation to be scheduled after chest CTA completed and if results negative. EGD benefits and risks discussed including risk with sedation, risk of bleeding, perforation and infection  ?-Pantoprazole 40 mg daily ?-ENT evaluation to include lower endoscopy to rule out pharyngeal etiology for blood in mucus, oral dysphagia and to confirm if she has sinusitis or mucositis or any other ENT related lesions contributing to her symptoms  ?-GERD diet discussed ?-Bacteria probiotic of choice once daily ? ?3) Colon cancer screen.  Patient reported negative Cologuard 05/03/2021. ?-Recommend colonoscopy at time of EGD benefits and risks discussed including risk with sedation, risk of bleeding, perforation and infection  ? ?4) Hepatic steatosis.  Abdominal ultrasound with hepatic elastography  01/2020 showed a medium K PA 25.5 indicating increased risk for cirrhosis ?-CMP as ordered above ?-Recommend follow-up appointment after EGD and colonoscopy completed to further address hepatic steatosis ? ? ? ? ? ? ? ? ?CC:

## 2021-08-05 NOTE — Patient Instructions (Addendum)
RECOMMENDATIONS: ? ?Contact your primary care provider regarding coughing up blood, recommend chest CT Angiogram. ?Go to the emergency room if you continue to cough up blood. ?Continue taking Pantoprazole 40 MG once a day. ?Contact our office after you have a chest CT scan done, if normal then we can schedule a colonoscopy and EGD. ? ?We place placed a referral to Dr. Elba Barman ?Otolaryngologist in [place], [place] ?Address: [address], [place], [place] [address] ?Phone: [phone].  ?You should hear from their office in the next couple weeks to schedule an appointment. If you do not hear from them please call them at [phone]. ? ?Please proceed to the basement level for lab work before leaving today. Press "B" on the elevator. The lab is located at the first door on the left as you exit the elevator. ? ?HEALTHCARE LAWS AND MY CHART RESULTS:  ? ?Due to recent changes in healthcare laws, you may see results of your imaging and/or laboratory studies on MyChart before I have had a chance to review them.  I understand that in some cases there may be results that are confusing or concerning to you. Please understand that not all results are received at the same time and often I may need to interpret multiple results in order to provide you with the best plan of care or course of treatment. Therefore, I ask that you please give me 48 hours to thoroughly review all your results before contacting my office for clarification.  ? ?Thank you for trusting me with your gastrointestinal care!   ? ?Noralyn Pick, CRNP ? ? ? ?BMI: ? ?If you are age 64 or older, your body mass index should be between 23-30. Your Body mass index is 37.28 kg/m?Marland Kitchen If this is out of the aforementioned range listed, please consider follow up with your Primary Care Provider. ? ?If you are age 58 or younger, your body mass index should be between 19-25. Your Body mass index is 37.28 kg/m?Marland Kitchen If this is out of the aformentioned  range listed, please consider follow up with your Primary Care Provider.  ? ?MY CHART: ? ?The [facility] providers would like to encourage you to use [facility] to communicate with providers for non-urgent requests or questions.  Due to long hold times on the telephone, sending your provider a message by [facility] may be a faster and more efficient way to get a response.  Please allow 48 business hours for a response.  Please remember that this is for non-urgent requests.  ? ? ?

## 2021-08-06 NOTE — Progress Notes (Signed)
Bethany Richardson, please let the patient know Dr. Ardis Hughs reviewed her office visit yesterday and due to her recent negative Cologuard test he did not recommend pursuing a screening colonoscopy at this time, next colon cancer screening test due 04/2024. ? ?Please also obtain update if she was able to contact her PCP to facilitate scheduling a chest CTA due to her hemoptysis. THX ? ?

## 2021-08-06 NOTE — Progress Notes (Signed)
I discussed with Dr Livia Snellen (PCP),  Patient will come in tomorrow  08/06/2021 to reassess symptoms and have  imagining orders completed. ?

## 2021-08-06 NOTE — Progress Notes (Signed)
I agree with the above note, plan except that I don't think she needs a colonoscopy for colon cancer screening since she just had a cologuard for colon cancer screening 3 months ago and it was negative. Next colon cancer screening test should be around 04/2024. ?

## 2021-08-07 ENCOUNTER — Ambulatory Visit (INDEPENDENT_AMBULATORY_CARE_PROVIDER_SITE_OTHER): Payer: 59 | Admitting: Family Medicine

## 2021-08-07 ENCOUNTER — Encounter: Payer: Self-pay | Admitting: Family Medicine

## 2021-08-07 VITALS — BP 138/79 | HR 95 | Temp 97.9°F | Ht 67.0 in | Wt 238.4 lb

## 2021-08-07 DIAGNOSIS — E871 Hypo-osmolality and hyponatremia: Secondary | ICD-10-CM | POA: Diagnosis not present

## 2021-08-07 DIAGNOSIS — E1165 Type 2 diabetes mellitus with hyperglycemia: Secondary | ICD-10-CM

## 2021-08-07 DIAGNOSIS — R739 Hyperglycemia, unspecified: Secondary | ICD-10-CM

## 2021-08-07 DIAGNOSIS — R042 Hemoptysis: Secondary | ICD-10-CM

## 2021-08-07 LAB — BAYER DCA HB A1C WAIVED: HB A1C (BAYER DCA - WAIVED): 12.8 % — ABNORMAL HIGH (ref 4.8–5.6)

## 2021-08-07 MED ORDER — OZEMPIC (0.25 OR 0.5 MG/DOSE) 2 MG/1.5ML ~~LOC~~ SOPN: 0.5000 mg | PEN_INJECTOR | SUBCUTANEOUS | 2 refills | Status: DC

## 2021-08-07 NOTE — Progress Notes (Signed)
? ?Subjective:  ?Patient ID: Bethany Richardson, female    DOB: 13-Jun-1970  Age: 51 y.o. MRN: [id number] ? ?CC: No chief complaint on file. ? ? ?HPI ?[name] presents for cough with blood. Drinking 8 bottles of water a day. Drinking a lot of orange juice Eyes are blurry. No fyspnea Onset 2-3 months ago. Gradually increasing ? ? ?  08/07/2021  ?  3:08 PM 07/23/2021  ? 11:41 AM 06/02/2021  ? 12:49 PM  ?Depression screen PHQ 2/9  ?Decreased Interest 0 0 0  ?Down, Depressed, Hopeless 0 0 0  ?PHQ - 2 Score 0 0 0  ?Altered sleeping  0   ?Tired, decreased energy  0   ?Change in appetite  0   ?Feeling bad or failure about yourself   0   ?Trouble concentrating  0   ?Moving slowly or fidgety/restless  0   ?Suicidal thoughts  0   ?PHQ-9 Score  0   ?Difficult doing work/chores  Not difficult at all   ? ? ?History ?Melaney has a past medical history of Adenomyosis, ASCUS of cervix with negative high risk HPV (11/2017), Chronic bronchitis ([place]), Depression, GERD (gastroesophageal reflux disease), Hypertension, Menorrhagia, Obesity, and Pneumonia (2013).  ? ?She has a past surgical history that includes Tubal ligation (Bilateral, 1995); Hysteroscopy with novasure (N/A, 10/19/2014); Robotic assisted total hysterectomy with bilateral salpingo oophorectomy (Bilateral, 01/19/2018); and Breast biopsy (Right, 11/23/2017).  ? ?Her family history includes Diabetes in her maternal aunt and paternal aunt; Hypertension in her father and mother; Ovarian cancer in her maternal grandmother.She reports that she quit smoking about 12 years ago. Her smoking use included cigarettes. She has a 40.00 pack-year smoking history. She has never used smokeless tobacco. She reports current alcohol use. She reports that she does not use drugs. ? ? ? ?ROS ?Review of Systems  ?Constitutional: Negative.   ?HENT: Negative.    ?Eyes:  Negative for visual disturbance.  ?Respiratory:  Positive for cough. Negative for shortness of breath.   ?Cardiovascular:  Negative  for chest pain.  ?Gastrointestinal:  Negative for abdominal pain.  ?Musculoskeletal:  Negative for arthralgias.  ? ?Objective:  ?BP 138/79   Pulse 95   Temp 97.9 ?F (36.6 ?C)   Ht 5' 7" (1.702 m)   Wt 238 lb 6.4 oz (108.1 kg)   LMP 01/13/2018 (Exact Date)   SpO2 94%   BMI 37.34 kg/m?  ? ?BP Readings from Last 3 Encounters:  ?08/07/21 138/79  ?08/05/21 136/90  ?07/23/21 137/80  ? ? ?Wt Readings from Last 3 Encounters:  ?08/07/21 238 lb 6.4 oz (108.1 kg)  ?08/05/21 238 lb (108 kg)  ?07/23/21 236 lb (107 kg)  ? ? ? ?Physical Exam ?Constitutional:   ?   General: She is not in acute distress. ?   Appearance: She is well-developed.  ?HENT:  ?   Head: Normocephalic and atraumatic.  ?Eyes:  ?   Conjunctiva/sclera: Conjunctivae normal.  ?   Pupils: Pupils are equal, round, and reactive to light.  ?Neck:  ?   Thyroid: No thyromegaly.  ?Cardiovascular:  ?   Rate and Rhythm: Normal rate and regular rhythm.  ?   Heart sounds: Normal heart sounds. No murmur heard. ?Pulmonary:  ?   Effort: Pulmonary effort is normal. No respiratory distress.  ?   Breath sounds: Normal breath sounds. No wheezing or rales.  ?Abdominal:  ?   General: Bowel sounds are normal. There is no distension.  ?   Palpations: Abdomen  is soft.  ?   Tenderness: There is no abdominal tenderness.  ?Musculoskeletal:     ?   General: Normal range of motion.  ?   Cervical back: Normal range of motion and neck supple.  ?Lymphadenopathy:  ?   Cervical: No cervical adenopathy.  ?Skin: ?   General: Skin is warm and dry.  ?Neurological:  ?   Mental Status: She is alert and oriented to person, place, and time.  ?Psychiatric:     ?   Behavior: Behavior normal.     ?   Thought Content: Thought content normal.     ?   Judgment: Judgment normal.  ? ? ? ? ?Assessment & Plan:  ? ?Diagnoses and all orders for this visit: ? ?Elevated blood sugar ?-     Bayer DCA Hb A1c Waived ? ?Low sodium levels ?-     CMP14+EGFR ? ?Cough with hemoptysis ?-     CT Chest Wo Contrast;  Future ?-     APTT ?-     Protime-INR ? ?Uncontrolled type 2 diabetes mellitus with hyperglycemia ([place]) ? ?Other orders ?-     Semaglutide,0.25 or 0.5MG/DOS, (OZEMPIC, 0.25 OR 0.5 MG/DOSE,) 2 MG/1.5ML SOPN; Inject 0.5 mg into the skin once a week. ? ? ? ? ? ? ?I am having Jaylanie C. Guia start on Ozempic (0.25 or 0.5 MG/DOSE). I am also having her maintain her estradiol, fexofenadine-pseudoephedrine, amLODipine, busPIRone, aspirin EC, methocarbamol, gabapentin, and pantoprazole. ? ?Allergies as of 08/07/2021   ? ?   Reactions  ? Zoloft [sertraline Hcl] Other (See Comments)  ? Suicide attempt.  ? Ace Inhibitors Cough  ? Tizanidine Swelling  ? ?  ? ?  ?Medication List  ?  ? ?  ? Accurate as of August 07, 2021 11:59 PM. If you have any questions, ask your nurse or doctor.  ?  ?  ? ?  ? ?amLODipine 10 MG tablet ?Commonly known as: NORVASC ?Take 1 tablet (10 mg total) by mouth daily. For blood pressure ?  ?aspirin EC 81 MG tablet ?Take 81 mg by mouth in the morning and at bedtime. Swallow whole. ?  ?busPIRone 5 MG tablet ?Commonly known as: BUSPAR ?Take 5 mg by mouth 2 (two) times daily. ?  ?estradiol 1 MG tablet ?Commonly known as: ESTRACE ?Take 1.5 tablets (1.5 mg total) by mouth daily. ?  ?fexofenadine-pseudoephedrine 180-240 MG 24 hr tablet ?Commonly known as: ALLEGRA-D 24 ?Take 1 tablet by mouth every evening. For allergy and congestion ?  ?gabapentin 100 MG capsule ?Commonly known as: NEURONTIN ?TAKE 1 CAPSULE BY MOUTH THREE TIMES DAILY FOR 14 DAYS ?  ?methocarbamol 500 MG tablet ?Commonly known as: ROBAXIN ?Take 1 tablet (500 mg total) by mouth every 6 (six) hours as needed for muscle spasms. ?  ?Ozempic (0.25 or 0.5 MG/DOSE) 2 MG/1.5ML Sopn ?Generic drug: Semaglutide(0.25 or 0.5MG/DOS) ?Inject 0.5 mg into the skin once a week. ?Started by: Claretta Fraise, MD ?  ?pantoprazole 40 MG tablet ?Commonly known as: PROTONIX ?Take 1 tablet (40 mg total) by mouth daily. ?  ? ?  ? ? ? ?Follow-up: Return in about 1 month  (around 09/07/2021). ? ?Claretta Fraise, M.D. ?

## 2021-08-08 LAB — CMP14+EGFR
ALT: 44 IU/L — ABNORMAL HIGH (ref 0–32)
AST: 32 IU/L (ref 0–40)
Albumin/Globulin Ratio: 1.9 (ref 1.2–2.2)
Albumin: 4.1 g/dL (ref 3.8–4.8)
Alkaline Phosphatase: 90 IU/L (ref 44–121)
BUN/Creatinine Ratio: 11 (ref 9–23)
BUN: 10 mg/dL (ref 6–24)
Bilirubin Total: 0.7 mg/dL (ref 0.0–1.2)
CO2: 24 mmol/L (ref 20–29)
Calcium: 9.4 mg/dL (ref 8.7–10.2)
Chloride: 96 mmol/L (ref 96–106)
Creatinine, Ser: 0.88 mg/dL (ref 0.57–1.00)
Globulin, Total: 2.2 g/dL (ref 1.5–4.5)
Glucose: 397 mg/dL — ABNORMAL HIGH (ref 70–99)
Potassium: 4.5 mmol/L (ref 3.5–5.2)
Sodium: 135 mmol/L (ref 134–144)
Total Protein: 6.3 g/dL (ref 6.0–8.5)
eGFR: 81 mL/min/{1.73_m2} (ref >59)

## 2021-08-08 LAB — APTT: aPTT: 28 s (ref 24–33)

## 2021-08-08 LAB — PROTIME-INR
INR: 1 (ref 0.9–1.2)
Prothrombin Time: 10.3 s (ref 9.1–12.0)

## 2021-08-10 ENCOUNTER — Encounter: Payer: Self-pay | Admitting: Family Medicine

## 2021-08-14 ENCOUNTER — Encounter: Payer: Self-pay | Admitting: Family Medicine

## 2021-08-15 MED ORDER — BLOOD GLUCOSE TEST STRIPS 333 VI STRP: 100.0000 | ORAL_STRIP | Freq: Three times a day (TID) | 6 refills | Status: AC

## 2021-08-21 ENCOUNTER — Encounter: Payer: Self-pay | Admitting: Family Medicine

## 2021-08-26 MED ORDER — OZEMPIC (0.25 OR 0.5 MG/DOSE) 2 MG/1.5ML ~~LOC~~ SOPN: 0.5000 mg | PEN_INJECTOR | SUBCUTANEOUS | 2 refills | Status: DC

## 2021-08-27 ENCOUNTER — Telehealth: Payer: Self-pay | Admitting: Family Medicine

## 2021-08-27 NOTE — Telephone Encounter (Signed)
(  Key: [id number]) ?Rx #: [id number] ?Ozempic (0.25 or 0.5 MG/DOSE) 2MG/3ML pen-injectors ? ? ?PA in process ?

## 2021-08-28 NOTE — Telephone Encounter (Signed)
Okay to give her a sample? ?

## 2021-08-29 NOTE — Telephone Encounter (Signed)
Additional information sent to plan today per insurance  ?

## 2021-08-29 NOTE — Telephone Encounter (Signed)
This request has received a Favorable outcome. ? ?Please note any additional information provided by [facility] at the bottom of this request. ?

## 2021-08-29 NOTE — Telephone Encounter (Signed)
Pharmacy aware

## 2021-09-02 ENCOUNTER — Ambulatory Visit (HOSPITAL_COMMUNITY)
Admission: RE | Admit: 2021-09-02 | Discharge: 2021-09-02 | Disposition: A | Discharge status: Home / Self Care | Payer: 59 | Source: Ambulatory Visit | Attending: Family Medicine | Admitting: Family Medicine

## 2021-09-02 DIAGNOSIS — R042 Hemoptysis: Secondary | ICD-10-CM | POA: Insufficient documentation

## 2021-09-03 NOTE — Progress Notes (Signed)
My Chart was sent 08/15/21 and read same day by pt re: Dr. Ardis Hughs recommendations for repeat colonoscopy: ? ?Last read by Bethany Richardson at  7:59 PM on 08/15/2021. ? ?Appears chest CT was completed 09/02/21, though results not yet available for provider review. Routing this message to [facility], CRNP to serve as a reminder to review results and to advise if appropriate to proceed with scheduling EGD. POT pertaining specifically to recommendation for EGD from [place] 08/05/21: ? ?-EGD with possible esophageal dilatation to be scheduled after chest CTA completed and if results negative. EGD benefits and risks discussed including risk with sedation, risk of bleeding, perforation and infection  ? ?Did not see any updates re: ENT referral. Including [name], CMA for her to provide Bethany Richardson, CRNP with update re: ENT appt. ? ?Will await response from [facility], CRNP re: scheduling EGD ? ?

## 2021-09-04 ENCOUNTER — Encounter: Payer: Self-pay | Admitting: Family Medicine

## 2021-09-08 ENCOUNTER — Other Ambulatory Visit: Payer: Self-pay | Admitting: Family Medicine

## 2021-09-08 DIAGNOSIS — R161 Splenomegaly, not elsewhere classified: Secondary | ICD-10-CM

## 2021-09-09 ENCOUNTER — Encounter: Payer: Self-pay | Admitting: Family Medicine

## 2021-09-09 ENCOUNTER — Ambulatory Visit (INDEPENDENT_AMBULATORY_CARE_PROVIDER_SITE_OTHER): Payer: 59 | Admitting: Family Medicine

## 2021-09-09 VITALS — BP 137/80 | HR 95 | Temp 97.5°F | Ht 67.0 in | Wt 226.2 lb

## 2021-09-09 DIAGNOSIS — G8929 Other chronic pain: Secondary | ICD-10-CM

## 2021-09-09 DIAGNOSIS — M25561 Pain in right knee: Secondary | ICD-10-CM | POA: Diagnosis not present

## 2021-09-09 DIAGNOSIS — E119 Type 2 diabetes mellitus without complications: Secondary | ICD-10-CM

## 2021-09-09 DIAGNOSIS — M25562 Pain in left knee: Secondary | ICD-10-CM

## 2021-09-09 MED ORDER — GABAPENTIN 300 MG PO CAPS: ORAL_CAPSULE | ORAL | 0 refills | Status: DC

## 2021-09-09 NOTE — Progress Notes (Signed)
? ?Subjective:  ?Patient ID: Bethany Richardson, female    DOB: February 08, 1971  Age: 51 y.o. MRN: [id number] ? ?CC: Follow-up and Diabetes ? ? ?HPI ?Bethany Richardson presents for recheck of the new medication for her diabetes. Running around 130 in the mornings and 160-170 in the afternoon. Working on diet . Walking for exercise. Limited by left knee pain. Working with Ortho, Dr. Aline Brochure,  on knee.  He problems with these being enlarged in the past.  Is given her no options for pain relief.  Of note is that she takes gabapentin very small dose and decided to take Tylenol instead of that.  Unfortunately she is now taking 1500 mg of Tylenol up to 4 times a day.  Of note is that she had hepatomegaly and splenomegaly on a recent CT of her chest.  She has a dedicated CT of the abdomen pending.  She has had some problems with her liver and spleen being enlarged in the past. ? ? ? ?  09/09/2021  ?  3:58 PM 08/07/2021  ?  3:08 PM 07/23/2021  ? 11:41 AM  ?Depression screen PHQ 2/9  ?Decreased Interest 0 0 0  ?Down, Depressed, Hopeless 0 0 0  ?PHQ - 2 Score 0 0 0  ?Altered sleeping   0  ?Tired, decreased energy   0  ?Change in appetite   0  ?Feeling bad or failure about yourself    0  ?Trouble concentrating   0  ?Moving slowly or fidgety/restless   0  ?Suicidal thoughts   0  ?PHQ-9 Score   0  ?Difficult doing work/chores   Not difficult at all  ? ? ?History ?Bethany Richardson has a past medical history of Adenomyosis, ASCUS of cervix with negative high risk HPV (11/2017), Chronic bronchitis ([place]), Depression, GERD (gastroesophageal reflux disease), Hypertension, Menorrhagia, Obesity, and Pneumonia (2013).  ? ?She has a past surgical history that includes Tubal ligation (Bilateral, 1995); Hysteroscopy with novasure (N/A, 10/19/2014); Robotic assisted total hysterectomy with bilateral salpingo oophorectomy (Bilateral, 01/19/2018); and Breast biopsy (Right, 11/23/2017).  ? ?Her family history includes Diabetes in her maternal aunt and paternal aunt;  Hypertension in her father and mother; Ovarian cancer in her maternal grandmother.She reports that she quit smoking about 12 years ago. Her smoking use included cigarettes. She has a 40.00 pack-year smoking history. She has never used smokeless tobacco. She reports current alcohol use. She reports that she does not use drugs. ? ? ? ?ROS ?Review of Systems  ?Constitutional: Negative.   ?HENT: Negative.    ?Eyes:  Negative for visual disturbance.  ?Respiratory:  Negative for shortness of breath.   ?Cardiovascular:  Negative for chest pain.  ?Gastrointestinal:  Negative for abdominal pain.  ?Musculoskeletal:  Negative for arthralgias.  ? ?Objective:  ?BP 137/80   Pulse 95   Temp (!) 97.5 ?F (36.4 ?C)   Ht 5' 7"  (1.702 m)   Wt 226 lb 3.2 oz (102.6 kg)   LMP 01/13/2018 (Exact Date)   SpO2 96%   BMI 35.43 kg/m?  ? ?BP Readings from Last 3 Encounters:  ?09/09/21 137/80  ?08/07/21 138/79  ?08/05/21 136/90  ? ? ?Wt Readings from Last 3 Encounters:  ?09/09/21 226 lb 3.2 oz (102.6 kg)  ?08/07/21 238 lb 6.4 oz (108.1 kg)  ?08/05/21 238 lb (108 kg)  ? ? ? ?Physical Exam ?Constitutional:   ?   General: She is not in acute distress. ?   Appearance: She is well-developed.  ?Cardiovascular:  ?  Rate and Rhythm: Normal rate and regular rhythm.  ?Pulmonary:  ?   Breath sounds: Normal breath sounds.  ?Musculoskeletal:     ?   General: Normal range of motion.  ?Skin: ?   General: Skin is warm and dry.  ?Neurological:  ?   Mental Status: She is alert and oriented to person, place, and time.  ? ? ? ? ?Assessment & Plan:  ? ?Bethany Richardson was seen today for follow-up and diabetes. ? ?Diagnoses and all orders for this visit: ? ?Type 2 diabetes mellitus without complication, without long-term current use of insulin ([place]) ? ?Chronic pain of both knees ?-     gabapentin (NEURONTIN) 300 MG capsule; 1 at bedtime for1 week then 2  The next  week then 3 the next week then 4 daily. ? ? ? ? ? ? ?I have discontinued Bethany Richardson's gabapentin. I  am also having her start on gabapentin. Additionally, I am having her maintain her estradiol, fexofenadine-pseudoephedrine, amLODipine, busPIRone, aspirin EC, methocarbamol, pantoprazole, Blood Glucose Test Strips 333, and Ozempic (0.25 or 0.5 MG/DOSE). ? ?Allergies as of 09/09/2021   ? ?   Reactions  ? Zoloft [sertraline Hcl] Other (See Comments)  ? Suicide attempt.  ? Ace Inhibitors Cough  ? Tizanidine Swelling  ? ?  ? ?  ?Medication List  ?  ? ?  ? Accurate as of September 09, 2021  5:12 PM. If you have any questions, ask your nurse or doctor.  ?  ?  ? ?  ? ?amLODipine 10 MG tablet ?Commonly known as: NORVASC ?Take 1 tablet (10 mg total) by mouth daily. For blood pressure ?  ?aspirin EC 81 MG tablet ?Take 81 mg by mouth in the morning and at bedtime. Swallow whole. ?  ?Blood Glucose Test Strips 333 Strp ?100 strips by In Vitro route 3 (three) times daily. One touch ultra PRN ?  ?busPIRone 5 MG tablet ?Commonly known as: BUSPAR ?Take 5 mg by mouth 2 (two) times daily. ?  ?estradiol 1 MG tablet ?Commonly known as: ESTRACE ?Take 1.5 tablets (1.5 mg total) by mouth daily. ?  ?fexofenadine-pseudoephedrine 180-240 MG 24 hr tablet ?Commonly known as: ALLEGRA-D 24 ?Take 1 tablet by mouth every evening. For allergy and congestion ?  ?gabapentin 300 MG capsule ?Commonly known as: NEURONTIN ?1 at bedtime for1 week then 2  The next  week then 3 the next week then 4 daily. ?What changed:  ?medication strength ?See the new instructions. ?Changed by: Claretta Fraise, MD ?  ?methocarbamol 500 MG tablet ?Commonly known as: ROBAXIN ?Take 1 tablet (500 mg total) by mouth every 6 (six) hours as needed for muscle spasms. ?  ?Ozempic (0.25 or 0.5 MG/DOSE) 2 MG/1.5ML Sopn ?Generic drug: Semaglutide(0.25 or 0.5MG/DOS) ?Inject 0.5 mg into the skin once a week. ?  ?pantoprazole 40 MG tablet ?Commonly known as: PROTONIX ?Take 1 tablet (40 mg total) by mouth daily. ?  ? ?  ? ? ? ?Follow-up: Return in about 1 month (around 10/09/2021). ? ?Claretta Fraise, M.D. ?

## 2021-10-01 ENCOUNTER — Encounter: Payer: Self-pay | Admitting: Family Medicine

## 2021-10-09 ENCOUNTER — Ambulatory Visit: Payer: 59 | Admitting: Family Medicine

## 2021-10-14 ENCOUNTER — Other Ambulatory Visit: Payer: Self-pay | Admitting: Family Medicine

## 2021-10-14 DIAGNOSIS — G8929 Other chronic pain: Secondary | ICD-10-CM

## 2021-10-14 NOTE — Telephone Encounter (Signed)
Office visit needed for the knee pain. WS

## 2021-10-15 ENCOUNTER — Telehealth: Payer: Self-pay | Admitting: *Deleted

## 2021-10-15 MED ORDER — GABAPENTIN 300 MG PO CAPS: ORAL_CAPSULE | ORAL | 0 refills | Status: AC

## 2021-10-15 NOTE — Telephone Encounter (Signed)
Increase by one pill each week until taking 4. Take them all at bedtimie

## 2021-10-15 NOTE — Telephone Encounter (Signed)
Please clarify Gabapentin instructions

## 2021-10-16 NOTE — Telephone Encounter (Signed)
Clarified with pharmacy on 10/15/21

## 2021-10-22 ENCOUNTER — Ambulatory Visit: Payer: 59 | Admitting: Family Medicine

## 2021-11-09 ENCOUNTER — Other Ambulatory Visit: Payer: Self-pay | Admitting: Nurse Practitioner

## 2021-11-09 DIAGNOSIS — K219 Gastro-esophageal reflux disease without esophagitis: Secondary | ICD-10-CM

## 2021-11-15 ENCOUNTER — Other Ambulatory Visit: Payer: Self-pay | Admitting: Family Medicine

## 2021-11-20 ENCOUNTER — Other Ambulatory Visit: Payer: Self-pay | Admitting: Family Medicine

## 2021-11-21 ENCOUNTER — Encounter: Payer: Self-pay | Admitting: Family Medicine

## 2021-11-21 ENCOUNTER — Other Ambulatory Visit: Payer: Self-pay | Admitting: Family Medicine

## 2021-11-24 ENCOUNTER — Telehealth: Payer: Self-pay | Admitting: Family Medicine

## 2021-11-24 MED ORDER — OZEMPIC (0.25 OR 0.5 MG/DOSE) 2 MG/3ML ~~LOC~~ SOPN: 0.5000 mg | PEN_INJECTOR | SUBCUTANEOUS | 0 refills | Status: DC

## 2021-11-24 NOTE — Telephone Encounter (Signed)
Patient states that a PA will be sent over for medication.  Aware once PA is completed we will let her know

## 2021-11-27 ENCOUNTER — Telehealth: Payer: Self-pay | Admitting: *Deleted

## 2021-11-27 NOTE — Telephone Encounter (Signed)
PA for Ozempic-Sent to plan  [id number]  PA Case ID-  [id number]

## 2021-11-28 ENCOUNTER — Encounter: Payer: Self-pay | Admitting: Family Medicine

## 2021-11-28 ENCOUNTER — Telehealth: Payer: Self-pay | Admitting: Family Medicine

## 2021-11-28 ENCOUNTER — Other Ambulatory Visit: Payer: Self-pay | Admitting: Family Medicine

## 2021-11-28 NOTE — Telephone Encounter (Signed)
Pt informed that she could be nauseated or feel sick to her stomach if she misses a dose. Advised if she missed more than one dose that she may have to start back with a lower dose. Pt understood. She states that she was dropped from her previous insurance and now has a new one. States that she waiting on approval. PA was started on 7/13. Awaiting response.

## 2021-11-28 NOTE — Telephone Encounter (Signed)
Patient calling to make sure it will be okay that she did not take her Ozempic shot this week. Waiting for PA.

## 2021-12-01 ENCOUNTER — Encounter: Payer: Self-pay | Admitting: Family Medicine

## 2021-12-01 MED ORDER — OZEMPIC (0.25 OR 0.5 MG/DOSE) 2 MG/3ML ~~LOC~~ SOPN: 0.5000 mg | PEN_INJECTOR | SUBCUTANEOUS | 0 refills | Status: DC

## 2021-12-01 NOTE — Telephone Encounter (Signed)
Patient calls and states she hadn't had her ozempic shot in two weeks. Still waiting on PA and needs to know if we have samples she can get so she can take her shot. Please call.

## 2021-12-01 NOTE — Telephone Encounter (Signed)
Bethany Richardson (Key: [id number]) Rx #: [id number] Ozempic (0.25 or 0.5 MG/DOSE) '2MG'$ /3ML pen-injectors   Form Charity fundraiser PA Form (2017 [employer]) Created 4 days ago Sent to Plan 4 days ago Plan Response 4 days ago Submit Clinical Questions 4 days ago Determination Unfavorable 3 days ago Message from [employer] Your PA request has been denied. Additional information will be provided in the denial communication. (Message 1140)

## 2021-12-02 NOTE — Telephone Encounter (Signed)
Pt called to schedule an apt next available with Almyra Free is 12/23/2021. Pt says she cannot wait that long.

## 2021-12-03 ENCOUNTER — Telehealth: Payer: Self-pay | Admitting: Pharmacist

## 2021-12-03 DIAGNOSIS — E119 Type 2 diabetes mellitus without complications: Secondary | ICD-10-CM

## 2021-12-03 MED ORDER — RYBELSUS 3 MG PO TABS: 3.0000 mg | ORAL_TABLET | Freq: Every day | ORAL | 0 refills | Status: DC

## 2021-12-03 NOTE — Telephone Encounter (Signed)
Patient can't afford ozempic on insurance It appears she has a high deductible plan Will give rybelsus '3mg'$  daily samples until appt Patient to call insurance to see what is covered

## 2021-12-05 NOTE — Telephone Encounter (Signed)
Pt returned call. Says she did get Julies message and picked up the Rybelsus samples and has spoken with her insurance and got everything taken care of.

## 2021-12-05 NOTE — Telephone Encounter (Signed)
Left message for pt to return call.  Looks like pt is on Rybelsus. A Rx was sent on 7/19.  PA was started on Ozempic on 7/17.  Need clarification from pt on which she is using.

## 2021-12-17 ENCOUNTER — Encounter: Payer: Self-pay | Admitting: Family Medicine

## 2021-12-23 ENCOUNTER — Ambulatory Visit: Payer: 59 | Admitting: Pharmacist

## 2022-01-05 ENCOUNTER — Other Ambulatory Visit: Payer: Self-pay | Admitting: Family Medicine

## 2022-01-05 ENCOUNTER — Other Ambulatory Visit: Payer: Self-pay | Admitting: Nurse Practitioner

## 2022-01-05 DIAGNOSIS — K219 Gastro-esophageal reflux disease without esophagitis: Secondary | ICD-10-CM

## 2022-02-11 ENCOUNTER — Other Ambulatory Visit: Payer: Self-pay | Admitting: Family Medicine

## 2022-02-11 DIAGNOSIS — J069 Acute upper respiratory infection, unspecified: Secondary | ICD-10-CM

## 2022-02-16 ENCOUNTER — Telehealth: Payer: Self-pay

## 2022-02-16 NOTE — Telephone Encounter (Signed)
Transition Care Management Follow-up Telephone Call Date of discharge and from where: 02/12/22 [facility]   How have you been since you were released from the hospital? Patient states that she is doing well - She states that she was able to get back on Ozempic injections and started at the 0.25 dose   Items Reviewed: Did the pt receive and understand the discharge instructions provided? Yes  Medications obtained and verified? Yes  Other? No Any new allergies since your discharge? No  Dietary orders reviewed? No Do you have support at home? Yes   Home Care and Equipment/Supplies: Were home health services ordered? no If so, what is the name of the agency? NA  Has the agency set up a time to come to the patient's home? not applicable Were any new equipment or medical supplies ordered?  No What is the name of the medical supply agency? na Were you able to get the supplies/equipment? not applicable Do you have any questions related to the use of the equipment or supplies? No  Functional Questionnaire: (I = Independent and D = Dependent) ADLs: I  Bathing/Dressing- I  Meal Prep- I  Eating- I  Maintaining continence- I  Transferring/Ambulation- I  Managing Meds- I  Follow up appointments reviewed:  PCP Hospital f/u appt confirmed?  Patient declines appt at this time    [facility] f/u appt confirmed? No   Are transportation arrangements needed? No  If their condition worsens, is the pt aware to call PCP or go to the Emergency Dept.? Yes Was the patient provided with contact information for the PCP's office or ED? Yes Was to pt encouraged to call back with questions or concerns? Yes

## 2022-03-01 ENCOUNTER — Other Ambulatory Visit: Payer: Self-pay | Admitting: Family Medicine

## 2022-03-14 ENCOUNTER — Other Ambulatory Visit: Payer: Self-pay | Admitting: Family Medicine

## 2022-04-02 ENCOUNTER — Other Ambulatory Visit: Payer: Self-pay | Admitting: Family Medicine

## 2022-04-02 DIAGNOSIS — K219 Gastro-esophageal reflux disease without esophagitis: Secondary | ICD-10-CM

## 2022-04-03 ENCOUNTER — Encounter: Payer: Self-pay | Admitting: Family Medicine

## 2022-04-03 NOTE — Telephone Encounter (Signed)
Letter sent.

## 2022-04-03 NOTE — Telephone Encounter (Signed)
30 days sent today - ntbs

## 2022-05-22 ENCOUNTER — Other Ambulatory Visit: Payer: Self-pay | Admitting: Family Medicine

## 2022-05-22 DIAGNOSIS — K219 Gastro-esophageal reflux disease without esophagitis: Secondary | ICD-10-CM

## 2022-05-22 NOTE — Telephone Encounter (Signed)
I called pt & she had to switch PCP bc of her Insurance. We are out of Network. No appt was made w/[facility].

## 2022-05-22 NOTE — Telephone Encounter (Signed)
[employer] NTBS 30 days given 04/03/22

## 2022-07-16 ENCOUNTER — Encounter: Payer: Self-pay | Admitting: Radiology

## 2022-08-28 ENCOUNTER — Other Ambulatory Visit: Payer: Self-pay | Admitting: Family Medicine

## 2022-10-30 ENCOUNTER — Other Ambulatory Visit: Payer: Self-pay | Admitting: Family Medicine

## 2022-12-30 ENCOUNTER — Other Ambulatory Visit: Payer: Self-pay

## 2022-12-30 DIAGNOSIS — Z1231 Encounter for screening mammogram for malignant neoplasm of breast: Secondary | ICD-10-CM

## 2023-01-08 LAB — HM MAMMOGRAPHY

## 2023-01-15 ENCOUNTER — Encounter: Payer: Self-pay | Admitting: General Practice

## 2023-04-29 IMAGING — MG MM DIGITAL SCREENING BILAT W/ TOMO AND CAD
8 series · 8 of 24 positions shown · non-contrast
Comparison: Previous exam(s).

CLINICAL DATA: Screening.

EXAM:
DIGITAL SCREENING BILATERAL MAMMOGRAM WITH TOMOSYNTHESIS AND CAD
TECHNIQUE: Bilateral screening digital craniocaudal and mediolateral oblique
mammograms were obtained. Bilateral screening digital breast
tomosynthesis was performed. The images were evaluated with
computer-aided detection.

[L CC synth-2D]
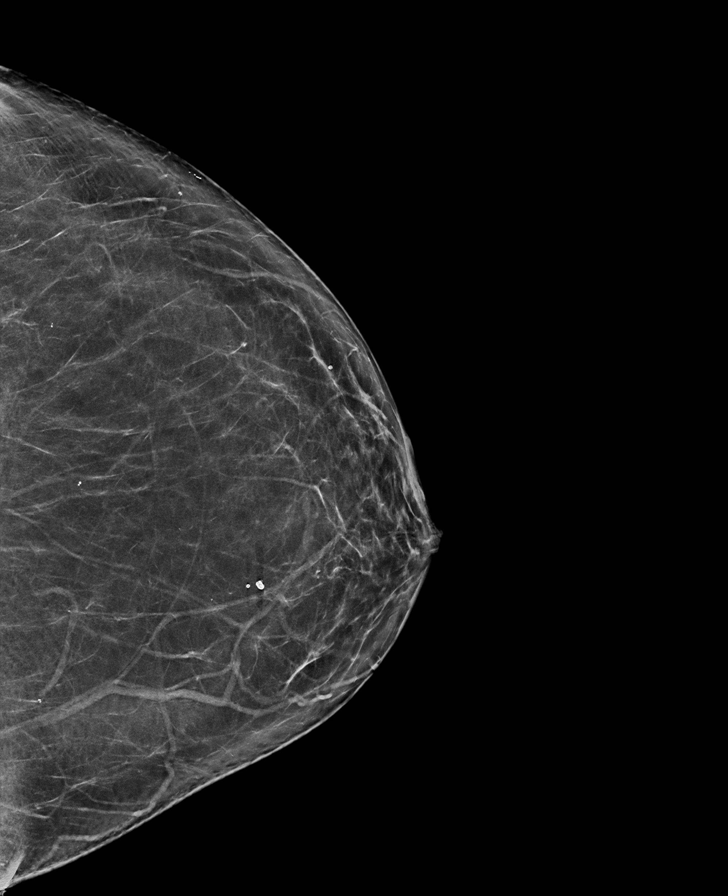

[R MLO synth-2D]
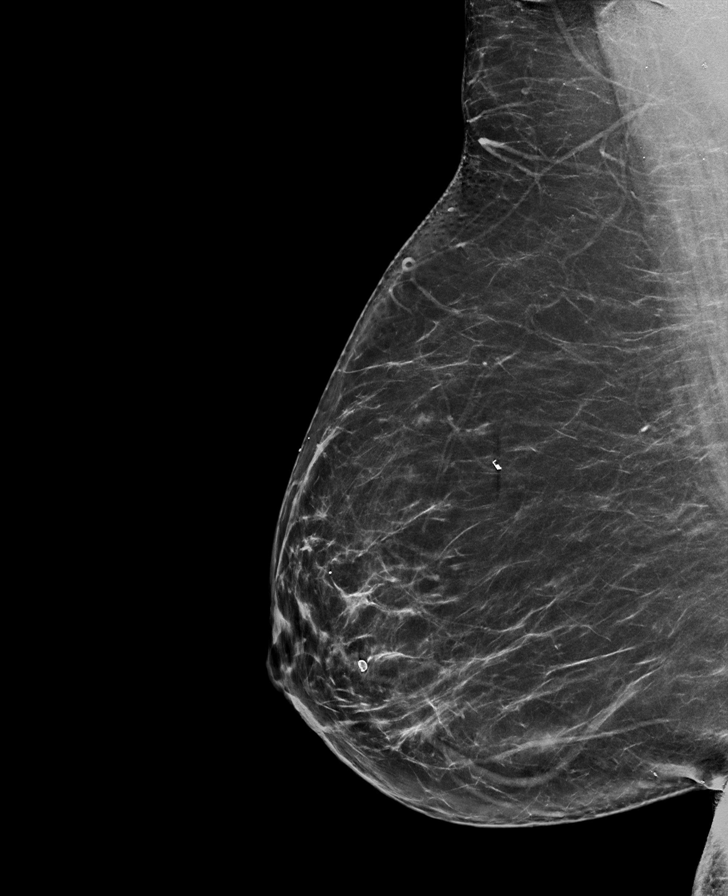

[R CC synth-2D]
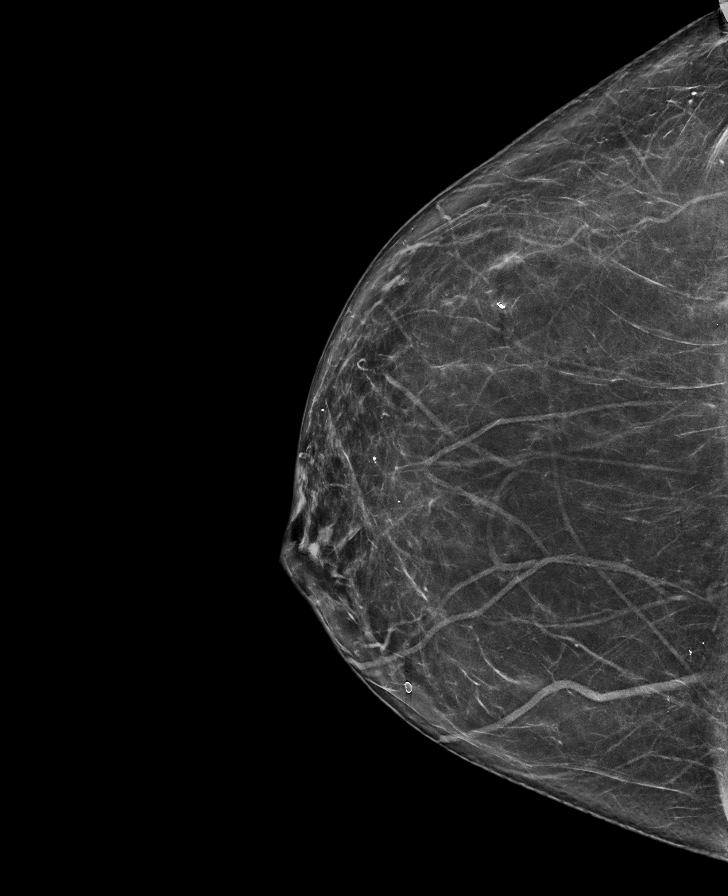

[L MLO synth-2D]
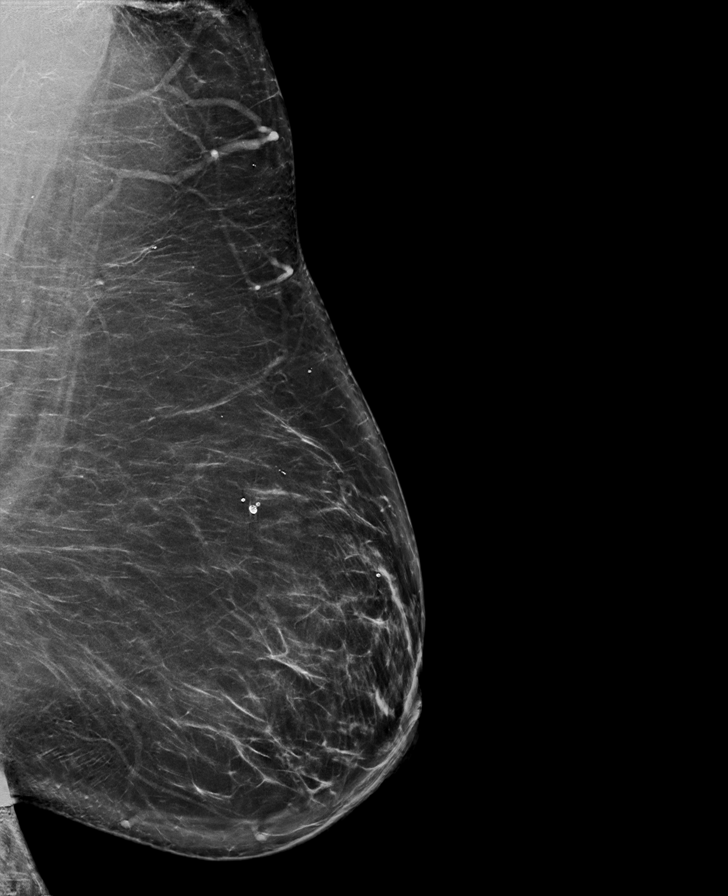

[L CC tomo · tomo slice 39/76.0]
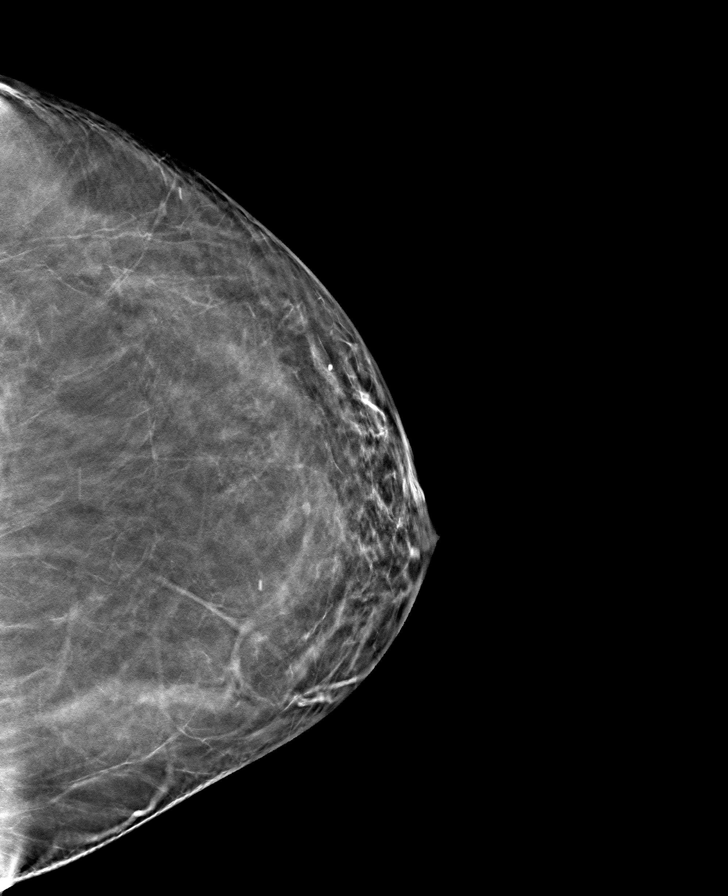

[R CC tomo · tomo slice 39/77.0]
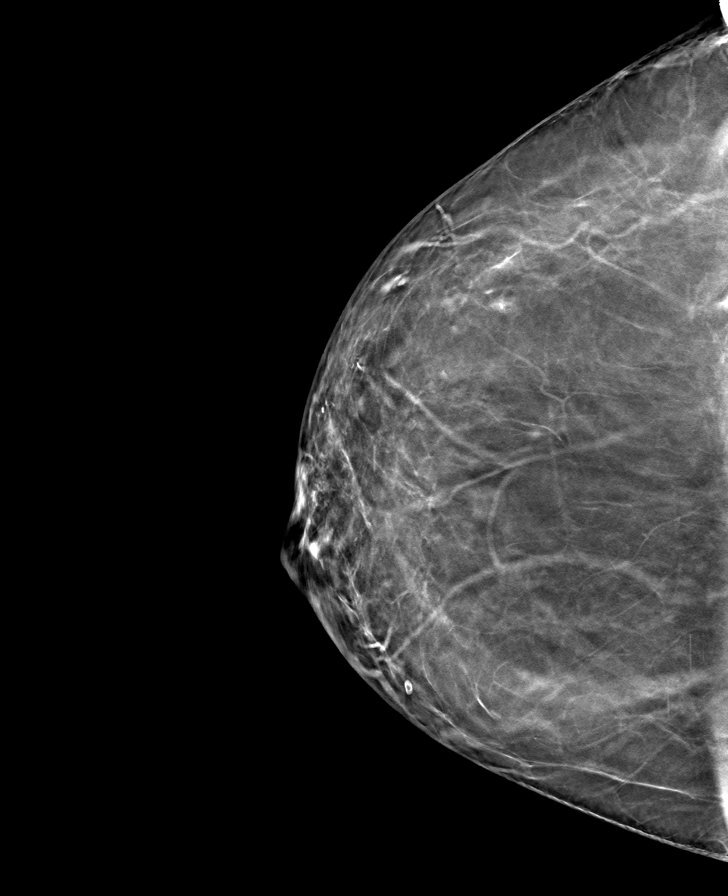

[R MLO tomo · tomo slice 41/81.0]
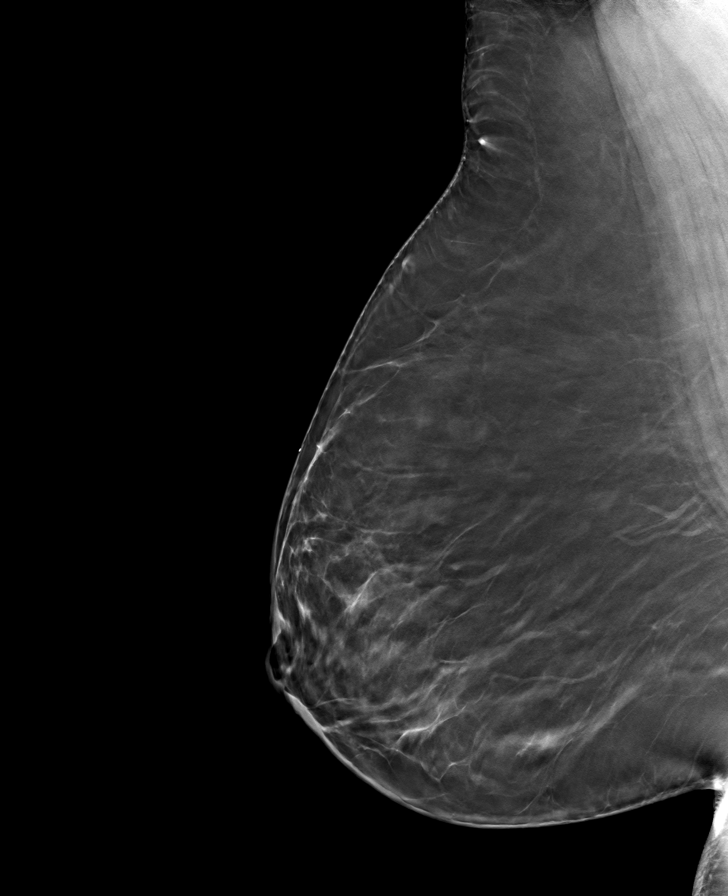

[L MLO tomo · tomo slice 45/88.0]
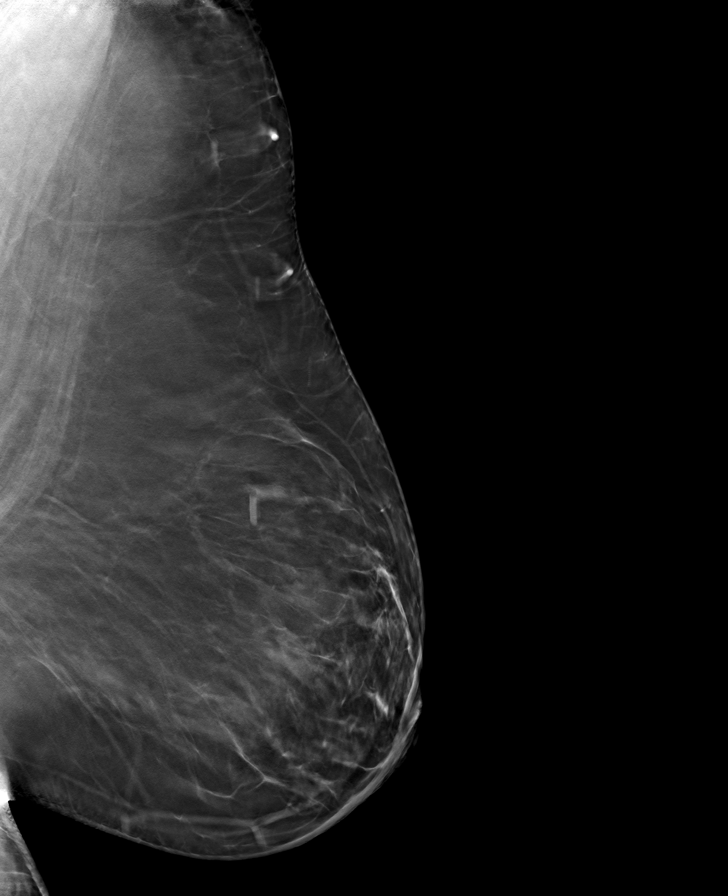

[8 of 24 positions shown; findings below may reference images not displayed]

ACR Breast Density Category b: There are scattered areas of
fibroglandular density.
FINDINGS: There are no findings suspicious for malignancy.
IMPRESSION: No mammographic evidence of malignancy. A result letter of this
screening mammogram will be mailed directly to the patient.

RECOMMENDATION:
Screening mammogram in one year. (Code:51-O-LD2)

BI-RADS CATEGORY  1: Negative.

## 2024-02-19 DIAGNOSIS — L539 Erythematous condition, unspecified: Secondary | ICD-10-CM | POA: Diagnosis not present

## 2024-02-19 DIAGNOSIS — J302 Other seasonal allergic rhinitis: Secondary | ICD-10-CM | POA: Diagnosis not present

## 2024-03-03 DIAGNOSIS — H2513 Age-related nuclear cataract, bilateral: Secondary | ICD-10-CM | POA: Diagnosis not present

## 2024-03-03 DIAGNOSIS — H34832 Tributary (branch) retinal vein occlusion, left eye, with macular edema: Secondary | ICD-10-CM | POA: Diagnosis not present

## 2024-03-03 DIAGNOSIS — E11319 Type 2 diabetes mellitus with unspecified diabetic retinopathy without macular edema: Secondary | ICD-10-CM | POA: Diagnosis not present
